# Patient Record
Sex: Female | Born: 1935 | Race: White | Hispanic: No | Marital: Married | Smoking: Former smoker
Health system: Southern US, Community
[De-identification: ages and names within clinical notes are randomized; demographics above are authoritative.]

## PROBLEM LIST (undated history)

## (undated) DIAGNOSIS — J302 Other seasonal allergic rhinitis: Secondary | ICD-10-CM

## (undated) DIAGNOSIS — I499 Cardiac arrhythmia, unspecified: Secondary | ICD-10-CM

## (undated) DIAGNOSIS — R011 Cardiac murmur, unspecified: Secondary | ICD-10-CM

## (undated) DIAGNOSIS — I38 Endocarditis, valve unspecified: Secondary | ICD-10-CM

## (undated) DIAGNOSIS — R748 Abnormal levels of other serum enzymes: Secondary | ICD-10-CM

## (undated) DIAGNOSIS — R05 Cough: Secondary | ICD-10-CM

## (undated) DIAGNOSIS — K279 Peptic ulcer, site unspecified, unspecified as acute or chronic, without hemorrhage or perforation: Secondary | ICD-10-CM

## (undated) DIAGNOSIS — N952 Postmenopausal atrophic vaginitis: Secondary | ICD-10-CM

## (undated) DIAGNOSIS — J45909 Unspecified asthma, uncomplicated: Secondary | ICD-10-CM

## (undated) DIAGNOSIS — I10 Essential (primary) hypertension: Secondary | ICD-10-CM

## (undated) DIAGNOSIS — L439 Lichen planus, unspecified: Secondary | ICD-10-CM

## (undated) DIAGNOSIS — R55 Syncope and collapse: Secondary | ICD-10-CM

## (undated) DIAGNOSIS — K219 Gastro-esophageal reflux disease without esophagitis: Secondary | ICD-10-CM

## (undated) DIAGNOSIS — E785 Hyperlipidemia, unspecified: Secondary | ICD-10-CM

## (undated) DIAGNOSIS — M1990 Unspecified osteoarthritis, unspecified site: Secondary | ICD-10-CM

## (undated) DIAGNOSIS — C50919 Malignant neoplasm of unspecified site of unspecified female breast: Secondary | ICD-10-CM

## (undated) DIAGNOSIS — C50911 Malignant neoplasm of unspecified site of right female breast: Secondary | ICD-10-CM

## (undated) DIAGNOSIS — R053 Chronic cough: Secondary | ICD-10-CM

## (undated) HISTORY — DX: Peptic ulcer, site unspecified, unspecified as acute or chronic, without hemorrhage or perforation: K27.9

## (undated) HISTORY — PX: CARPAL TUNNEL RELEASE: SHX101

## (undated) HISTORY — DX: Syncope and collapse: R55

## (undated) HISTORY — DX: Malignant neoplasm of unspecified site of right female breast: C50.911

## (undated) HISTORY — PX: VAGINOPLASTY: SHX329

## (undated) HISTORY — DX: Postmenopausal atrophic vaginitis: N95.2

## (undated) HISTORY — DX: Hyperlipidemia, unspecified: E78.5

## (undated) HISTORY — PX: COLONOSCOPY: SHX174

## (undated) HISTORY — DX: Unspecified asthma, uncomplicated: J45.909

## (undated) HISTORY — DX: Unspecified osteoarthritis, unspecified site: M19.90

## (undated) HISTORY — PX: BREAST EXCISIONAL BIOPSY: SUR124

## (undated) HISTORY — DX: Essential (primary) hypertension: I10

## (undated) HISTORY — PX: COLONOSCOPY W/ POLYPECTOMY: SHX1380

## (undated) HISTORY — DX: Lichen planus, unspecified: L43.9

---

## 2004-08-22 ENCOUNTER — Ambulatory Visit: Payer: Self-pay | Admitting: Internal Medicine

## 2005-11-28 ENCOUNTER — Ambulatory Visit: Payer: Self-pay | Admitting: Internal Medicine

## 2006-12-03 ENCOUNTER — Ambulatory Visit: Payer: Self-pay | Admitting: Internal Medicine

## 2007-01-02 ENCOUNTER — Ambulatory Visit: Payer: Self-pay | Admitting: Unknown Physician Specialty

## 2007-12-05 ENCOUNTER — Ambulatory Visit: Payer: Self-pay | Admitting: Internal Medicine

## 2008-06-01 ENCOUNTER — Ambulatory Visit: Payer: Self-pay | Admitting: Cardiology

## 2008-12-08 ENCOUNTER — Ambulatory Visit: Payer: Self-pay | Admitting: Internal Medicine

## 2009-04-14 DIAGNOSIS — E782 Mixed hyperlipidemia: Secondary | ICD-10-CM | POA: Insufficient documentation

## 2009-04-14 DIAGNOSIS — I10 Essential (primary) hypertension: Secondary | ICD-10-CM | POA: Insufficient documentation

## 2009-08-05 ENCOUNTER — Ambulatory Visit: Payer: Self-pay | Admitting: Cardiology

## 2009-08-20 ENCOUNTER — Telehealth: Payer: Self-pay | Admitting: Cardiology

## 2009-09-06 ENCOUNTER — Emergency Department: Payer: Self-pay | Admitting: Emergency Medicine

## 2009-12-28 ENCOUNTER — Ambulatory Visit: Payer: Self-pay | Admitting: Internal Medicine

## 2009-12-29 ENCOUNTER — Ambulatory Visit: Payer: Self-pay | Admitting: Internal Medicine

## 2010-02-01 ENCOUNTER — Ambulatory Visit: Payer: Self-pay | Admitting: Internal Medicine

## 2010-02-21 ENCOUNTER — Ambulatory Visit: Payer: Self-pay | Admitting: Surgery

## 2010-02-24 ENCOUNTER — Ambulatory Visit: Payer: Self-pay | Admitting: Surgery

## 2010-09-20 ENCOUNTER — Ambulatory Visit (HOSPITAL_BASED_OUTPATIENT_CLINIC_OR_DEPARTMENT_OTHER): Admission: RE | Admit: 2010-09-20 | Discharge: 2010-09-20 | Payer: Self-pay | Admitting: Orthopedic Surgery

## 2010-11-06 LAB — HM DEXA SCAN

## 2010-12-21 ENCOUNTER — Ambulatory Visit: Payer: Self-pay | Admitting: Obstetrics and Gynecology

## 2010-12-29 ENCOUNTER — Ambulatory Visit: Payer: Self-pay | Admitting: Internal Medicine

## 2011-01-17 LAB — BASIC METABOLIC PANEL
BUN: 14 mg/dL (ref 6–23)
GFR calc Af Amer: >60 mL/min (ref >60)
GFR calc non Af Amer: >60 mL/min (ref >60)
Potassium: 5.5 mEq/L — ABNORMAL HIGH (ref 3.5–5.1)

## 2011-01-17 LAB — POCT HEMOGLOBIN-HEMACUE: Hemoglobin: 15.4 g/dL — ABNORMAL HIGH (ref 12.0–15.0)

## 2011-03-21 NOTE — Assessment & Plan Note (Signed)
[facility] OFFICE NOTE   NAME:[name], [name]                           MRN:          [id number]  DATE:06/01/2008                            DOB:          Aug 01, 1933    [name] is referred by Dr. Aram Beecham to establish her  cardiologist.   She is a delightful 77 year old married white female, whose husband  recently transferred his care to me as well.   Fortunately, [name] is 77 years of age and looks much younger than  that.  She has hypertension and hyperlipidemia being followed and  managed by Dr. Aletha Halim.   She walks about an hour 5-6 days a week.  She walks at about a 4-mile  per hour pace.  She denies any angina or ischemic symptoms.   She was recently started on Bystolic 5 mg a day by Dr. Judithann Sheen for  hypertension.   She offers no complaints including orthopnea, PND, peripheral edema,  tachypalpitations, syncope, or presyncope.   PAST MEDICAL HISTORY:  In addition to the above, she has  gastroesophageal reflux.  She also has some problems with bronchospastic  disease.   SOCIAL HISTORY:  She does not smoke.  She quit in 1970.  She has 2  glasses of wine a day.   Her last cholesterol profile was 188, total triglycerides 153, HDL 46.3,  and LDL 109.  I think, this is on Lipitor, but is not absolutely clear.   CURRENT MEDICATIONS:  1. Lipitor 20 mg p.o. nightly.  2. Aspirin 81 mg a day.  3. Femhrt 5 mg a day.  4. Singulair 10 mg a day.  5. Centrum Silver daily.  6. Calcium daily.  7. Protonix 40 mg p.r.n.  8. Sublingual nitroglycerin 0.4 p.r.n.   She has been evaluated for atypical chest pain in the past by Dr. Lenise Herald.  A stress Myoview was negative for ischemia with EF of 71%.  This was actually performed on April 18, 2006.   FAMILY HISTORY:  Unremarkable for premature coronary artery disease.   SOCIAL HISTORY:  She is married.  She has three children.  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   She seems to be very health conscious.   REVIEW OF SYSTEMS:  Other than the HPI, 12 points of care reviewed and  are negative.  She does have some chronic arthritis and has had an ulcer  in the past.  Details unknown.   PHYSICAL EXAMINATION:  VITAL SIGNS:  She is 5 feet and 4 inches and  weighs 164 pounds.  Her blood pressure is 160/80 in the right arm, pulse  is 83 and regular.  HEENT:  Normocephalic and atraumatic.  PERRLA.  Extraocular movements  intact.  Sclerae clear.  Facial symmetry is normal.  NECK:  Carotid upstrokes are equal bilaterally without bruits.  No JVD.  Thyroid is not enlarged.  Trachea is midline.  LUNGS:  Clear with no rhonchi or wheezes.  HEART:  Reveals a nondisplaced, but  poorly appreciated PMI.  She has  normal S1 and S2.  No gallop.  ABDOMEN:  Soft.  Good bowel sounds.  No midline bruit.  No hepatomegaly.  EXTREMITIES:  Without cyanosis, clubbing, or edema.  Pulses are brisk.  NEURO:  Intact.  SKIN:  Unremarkable.   EKG shows sinus rhythm with an RSR prime in V1 and V2.  There is no  change from her outside ECG.   ASSESSMENT/PLAN:  [name] is currently without any clinically evident  cardiovascular disease.  I told her she probably has some  atherosclerotic plaque considering her age and her other risk factors.  However, she is on an excellent program, though I might consider trying  to drive her LDL below 045.  I have asked to continue with the aspirin  and see Dr. Judithann Sheen back for titration of her Bystolic.  He may have to  max this out before adding something else potentially.   I will see her back on an annual basis or p.r.n.     Thomas C. Daleen Squibb, MD, [facility]  Electronically Signed    TCW/MedQ  DD: 06/01/2008  DT: 06/02/2008  Job #: [id number]   cc:   Aram Beecham, MD

## 2011-04-27 ENCOUNTER — Encounter: Payer: Self-pay | Admitting: Cardiovascular Disease

## 2012-01-18 ENCOUNTER — Ambulatory Visit: Payer: Self-pay | Admitting: Internal Medicine

## 2012-03-07 ENCOUNTER — Ambulatory Visit: Payer: Self-pay | Admitting: Unknown Physician Specialty

## 2012-03-08 LAB — PATHOLOGY REPORT

## 2012-06-06 LAB — HM PAP SMEAR: HM PAP: NORMAL

## 2012-11-06 LAB — HM MAMMOGRAPHY: HM MAMMO: NORMAL

## 2012-11-06 LAB — HM COLONOSCOPY: HM COLON: NORMAL

## 2013-01-27 ENCOUNTER — Ambulatory Visit: Payer: Self-pay | Admitting: Internal Medicine

## 2014-01-28 ENCOUNTER — Ambulatory Visit: Payer: Self-pay | Admitting: Internal Medicine

## 2014-02-03 ENCOUNTER — Ambulatory Visit: Payer: Self-pay | Admitting: Internal Medicine

## 2014-08-06 ENCOUNTER — Ambulatory Visit: Payer: Self-pay | Admitting: Internal Medicine

## 2014-11-06 DIAGNOSIS — C50919 Malignant neoplasm of unspecified site of unspecified female breast: Secondary | ICD-10-CM

## 2014-11-06 HISTORY — PX: MASTECTOMY: SHX3

## 2014-11-06 HISTORY — DX: Malignant neoplasm of unspecified site of unspecified female breast: C50.919

## 2015-01-01 DIAGNOSIS — R079 Chest pain, unspecified: Secondary | ICD-10-CM | POA: Insufficient documentation

## 2015-02-01 ENCOUNTER — Ambulatory Visit: Payer: Self-pay | Admitting: Internal Medicine

## 2015-02-11 ENCOUNTER — Ambulatory Visit
Admit: 2015-02-11 | Disposition: A | Discharge status: Home / Self Care | Payer: Self-pay | Attending: Internal Medicine | Admitting: Internal Medicine

## 2015-02-16 ENCOUNTER — Ambulatory Visit
Admit: 2015-02-16 | Disposition: A | Discharge status: Home / Self Care | Payer: Self-pay | Attending: Internal Medicine | Admitting: Internal Medicine

## 2015-02-16 DIAGNOSIS — C50911 Malignant neoplasm of unspecified site of right female breast: Secondary | ICD-10-CM

## 2015-02-16 HISTORY — DX: Malignant neoplasm of unspecified site of right female breast: C50.911

## 2015-02-16 HISTORY — PX: BREAST BIOPSY: SHX20

## 2015-02-22 ENCOUNTER — Ambulatory Visit (INDEPENDENT_AMBULATORY_CARE_PROVIDER_SITE_OTHER): Payer: Medicare Other | Admitting: General Surgery

## 2015-02-22 ENCOUNTER — Encounter: Payer: Self-pay | Admitting: General Surgery

## 2015-02-22 VITALS — BP 130/70 | HR 68 | Resp 12 | Ht 63.0 in | Wt 167.0 lb

## 2015-02-22 DIAGNOSIS — C50211 Malignant neoplasm of upper-inner quadrant of right female breast: Secondary | ICD-10-CM | POA: Diagnosis not present

## 2015-02-22 DIAGNOSIS — D0511 Intraductal carcinoma in situ of right breast: Secondary | ICD-10-CM

## 2015-02-22 NOTE — Progress Notes (Signed)
Patient ID: [name], female   DOB: 03-Oct-1933, 81 y.o.   MRN: [id number]  Chief Complaint  Patient presents with  . Other    mammogram    HPI [name] is a 81 y.o. female who presents for a breast evaluation. She is here today with her husband Sonia Side and her daughter Magda Paganini. The most recent mammogram was done on 02/01/15 and added views on 02/11/15. Patient had her right breast biopsy done on 02/16/15. She denies any breast problems. Patient does perform regular self breast checks and gets regular mammograms done.    HPI  Past Medical History  Diagnosis Date  . Hypertension     Unspecified  . Hyperlipidemia     Mixed  . Cancer 02/16/15    Right breast    Past Surgical History  Procedure Laterality Date  . Breast lumpectomy Left 2012  . Breast biopsy Right 02/16/15    Family History  Problem Relation Age of Onset  . Colon cancer Paternal Grandfather 66  . Colon cancer Paternal Uncle 61    Social History History  Substance Use Topics  . Smoking status: Former Smoker    Quit date: 11/06/1968  . Smokeless tobacco: Never Used  . Alcohol Use: 0.0 oz/week    0 Standard drinks or equivalent per week    No Known Allergies  Current Outpatient Prescriptions  Medication Sig Dispense Refill  . aspirin 81 MG EC tablet Take 81 mg by mouth daily.      Marland Kitchen atorvastatin (LIPITOR) 20 MG tablet Take 20 mg by mouth daily.      . Calcium Carbonate-Vitamin D (CALCIUM-VITAMIN D3) 600-200 MG-UNIT TABS Take 1 tablet by mouth 2 (two) times daily.      . Calcium-Vitamin D 600-200 MG-UNIT per tablet Take by mouth.    . Multiple Vitamins-Minerals (CENTRUM) tablet Take 1 tablet by mouth daily.      . nebivolol (BYSTOLIC) 10 MG tablet TAKE 1 TABLET ONCE DAILY   PLEASE MAKE AN APPOINTMENT WITH YOUR DOCTOR    . norethindrone-ethinyl estradiol (FEMHRT LOW DOSE) 0.5-2.5 MG-MCG per tablet Take 1 tablet by mouth 2 (two) times daily.      . pantoprazole (PROTONIX) 40 MG tablet Take 40 mg by mouth 2  (two) times daily.      Marland Kitchen PATADAY 0.2 % SOLN     . montelukast (SINGULAIR) 10 MG tablet Take 10 mg by mouth daily.       No current facility-administered medications for this visit.    Review of Systems Review of Systems  Constitutional: Negative.   Respiratory: Negative.   Cardiovascular: Negative.     Blood pressure 130/70, pulse 68, resp. rate 12, height 5\' 3"  (1.6 m), weight 167 lb (75.751 kg).  Physical Exam Physical Exam  Constitutional: She is oriented to person, place, and time. She appears well-developed and well-nourished.  Eyes: Conjunctivae are normal. No scleral icterus.  Neck: Neck supple.  Cardiovascular: Normal rate, regular rhythm and normal heart sounds.   Pulmonary/Chest: Effort normal and breath sounds normal. Right breast exhibits no inverted nipple, no mass, no nipple discharge, no skin change and no tenderness. Left breast exhibits inverted nipple (due to lumpectomy). Left breast exhibits no mass, no nipple discharge, no skin change and no tenderness.  Right breast mild ecchymosis from recent biopsy  Abdominal: Soft. Normal appearance. There is no hepatomegaly. There is no tenderness.  Lymphadenopathy:    She has no cervical adenopathy.    She has no axillary  adenopathy.  Neurological: She is alert and oriented to person, place, and time.    Data Reviewed Mammogram and path reviewed. Imaging showed a small mass in LIQ and a larger lobulated mass in UIQ of right breast. 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 ER/PR pos and Her 2 negative   Assessment    CA right breast 2 foci as above. Lumpectomy will entail losing majority of the inner right breast and cosmetically unacceptable.  Total mastectomy with SN biopsy is best option and this was explained in full to pt. Current clinical stage is 1, and will be same if SN are negative.  Procedure, risks and benefits explained and pt is agreeable.     Plan    Schedule right breast mastectomy and SN biopsy.      PCP:  Braulio Bosch G 02/23/2015, 6:55 AM

## 2015-02-23 ENCOUNTER — Encounter: Payer: Self-pay | Admitting: General Surgery

## 2015-02-23 NOTE — Patient Instructions (Signed)
Surgery to be scheduledMastectomy, With or Without Reconstruction Mastectomy (removal of the breast) is a procedure most commonly used to treat cancer (tumor) of the breast. Different procedures are available for treatment. This depends on the stage of the tumor (abnormal growths). Discuss this with your caregiver, surgeon (a specialist for performing operations such as this), or oncologist (someone specialized in the treatment of cancer). With proper information, you can decide which treatment is best for you. Although the sound of the word cancer is frightening to all of [place], the new treatments and medications can be a source of reassurance and comfort. If there are things you are worried about, discuss them with your caregiver. He or she can help comfort you and your family. Some of the different procedures for treating breast cancer are:  Radical (extensive) mastectomy. This is an operation used to remove the entire breast, the muscles under the breast, and all of the glands (lymph nodes) under the arm. With all of the new treatments available for cancer of the breast, this procedure has become less common.  Modified radical mastectomy. This is a similar operation to the radical mastectomy described above. In the modified radical mastectomy, the muscles of the chest wall are not removed unless one of the lessor muscles is removed. One of the lessor muscles may be removed to allow better removal of the lymph nodes. The axillary lymph nodes are also removed. Rarely, during an axillary node dissection nerves to this area are damaged. Radiation therapy is then often used to the area following this surgery.  A total mastectomy also known as a complete or simple mastectomy. It involves removal of only the breast. The lymph nodes and the muscles are left in place.  In a lumpectomy, the lump is removed from the breast. This is the simplest form of surgical treatment. A sentinel lymph node biopsy may also be  done. Additional treatment may be required. RISKS AND COMPLICATIONS The main problems that follow removal of the breast include:  Infection (germs start growing in the wound). This can usually be treated with antibiotics (medications that kill germs).  Lymphedema. This means the arm on the side of the breast that was operated on swells because the lymph (tissue fluid) cannot follow the main channels back into the body. This only occurs when the lymph nodes have had to be removed under the arm.  There may be some areas of numbness to the upper arm and around the incision (cut by the surgeon) in the breast. This happens because of the cutting of or damage to some of the nerves in the area. This is most often unavoidable.  There may be difficulty moving the arm in a full range of motion (moving in all directions) following surgery. This usually improves with time following use and exercise.  Recurrence of breast cancer may happen with the very best of surgery and follow up treatment. Sometimes small cancer cells that cannot be seen with the naked eye have already spread at the time of surgery. When this happens other treatment is available. This treatment may be radiation, medications or a combination of both. RECONSTRUCTION Reconstruction of the breast may be done immediately if there is not going to be post-operative radiation. This surgery is done for cosmetic (improve appearance) purposes to improve the physical appearance after the operation. This may be done in two ways:  It can be done using a saline filled prosthetic (an artificial breast which is filled with salt water). Silicone breast  implants are now re-approved by the FDA and are being commonly used.  Reconstruction can be done using the body's own muscle/fat/skin. Your caregiver will discuss your options with you. Depending upon your needs or choice, together you will be able to determine which procedure is best for you. Document  Released: 07/18/2001 Document Revised: 07/17/2012 Document Reviewed: 03/10/2008 [facility] Patient Information 2015 [place], [place]. This information is not intended to replace advice given to you by your health care provider. Make sure you discuss any questions you have with your health care provider.

## 2015-02-26 ENCOUNTER — Telehealth: Payer: Self-pay

## 2015-02-26 NOTE — Telephone Encounter (Signed)
Patient called to schedule her mastectomy and SLN Biopsy. Patient is scheduled for surgery at [facility] on 03/19/15. She will pre admit at the hospital. Preadmission paperwork had been mailed to the patient. We will call her with an arrival time for her SLN injection on 03/19/15. Patient is aware of date and instructions.

## 2015-03-01 ENCOUNTER — Other Ambulatory Visit: Payer: Self-pay | Admitting: General Surgery

## 2015-03-01 ENCOUNTER — Other Ambulatory Visit: Payer: Self-pay

## 2015-03-01 DIAGNOSIS — C50211 Malignant neoplasm of upper-inner quadrant of right female breast: Secondary | ICD-10-CM

## 2015-03-01 LAB — SURGICAL PATHOLOGY

## 2015-03-03 ENCOUNTER — Other Ambulatory Visit: Payer: Self-pay | Admitting: *Deleted

## 2015-03-03 ENCOUNTER — Ambulatory Visit
Admit: 2015-03-03 | Disposition: A | Discharge status: Home / Self Care | Payer: Self-pay | Attending: Oncology | Admitting: Oncology

## 2015-03-03 ENCOUNTER — Other Ambulatory Visit: Payer: Self-pay | Admitting: General Surgery

## 2015-03-03 DIAGNOSIS — C50211 Malignant neoplasm of upper-inner quadrant of right female breast: Secondary | ICD-10-CM

## 2015-03-11 ENCOUNTER — Other Ambulatory Visit: Payer: Self-pay

## 2015-03-11 ENCOUNTER — Encounter
Admission: RE | Admit: 2015-03-11 | Discharge: 2015-03-11 | Disposition: A | Discharge status: Home / Self Care | Payer: Medicare Other | Source: Ambulatory Visit | Attending: General Surgery | Admitting: General Surgery

## 2015-03-11 DIAGNOSIS — I10 Essential (primary) hypertension: Secondary | ICD-10-CM | POA: Insufficient documentation

## 2015-03-11 DIAGNOSIS — Z01810 Encounter for preprocedural cardiovascular examination: Secondary | ICD-10-CM | POA: Insufficient documentation

## 2015-03-11 HISTORY — DX: Gastro-esophageal reflux disease without esophagitis: K21.9

## 2015-03-11 HISTORY — DX: Other seasonal allergic rhinitis: J30.2

## 2015-03-11 HISTORY — DX: Abnormal levels of other serum enzymes: R74.8

## 2015-03-11 NOTE — Patient Instructions (Signed)
  Your procedure is scheduled on: 03/19/15 Report to Day Surgery. To find out your arrival time please call [phone] between 1PM - 3PM on 03/18/15.  Remember: Instructions that are not followed completely may result in serious medical risk, up to and including death, or upon the discretion of your surgeon and anesthesiologist your surgery may need to be rescheduled.    __x__ 1. Do not eat food or drink liquids after midnight. No gum chewing or hard candies.     _x___ 2. No Alcohol for 24 hours before or after surgery.   ____ 3. Bring all medications with you on the day of surgery if instructed.    __x__ 4. Notify your doctor if there is any change in your medical condition     (cold, fever, infections).     Do not wear jewelry, make-up, hairpins, clips or nail polish.  Do not wear lotions, powders, or perfumes. You may wear deodorant.  Do not shave 48 hours prior to surgery. Men may shave face and neck.  Do not bring valuables to the hospital.    [facility] is not responsible for any belongings or valuables.               Contacts, dentures or bridgework may not be worn into surgery.  Leave your suitcase in the car. After surgery it may be brought to your room.  For patients admitted to the hospital, discharge time is determined by your                treatment team.   Patients discharged the day of surgery will not be allowed to drive home.   Please read over the following fact sheets that you were given:      ____ Take these medicines the morning of surgery with A SIP OF WATER:    1. nebivolol (BYSTOLIC) 10 MG tablet  2. pantoprazole (PROTONIX) 40 MG tablet  3.   4.  5.  6.  ____ Fleet Enema (as directed)   ____ Use CHG Soap as directed  ____ Use inhalers on the day of surgery  ____ Stop metformin 2 days prior to surgery    ____ Take 1/2 of usual insulin dose the night before surgery and none on the morning of surgery.   _x__ Stop Coumadin/Plavix/aspirin on  aspirin 1 week before surgery   __x_ Stop Anti-inflammatories on stop   ____ Stop supplements until after surgery.    ____ Bring C-Pap to the hospital.

## 2015-03-15 MED ORDER — HEPARIN SODIUM (PORCINE) 1000 UNIT/ML IJ SOLN
INTRAMUSCULAR | Status: AC
  Filled 2015-03-15: qty 1

## 2015-03-15 MED ORDER — VERAPAMIL HCL 2.5 MG/ML IV SOLN
INTRAVENOUS | Status: AC
  Filled 2015-03-15: qty 2

## 2015-03-15 NOTE — OR Nursing (Addendum)
Negative stress test 01/11/15 Dr Nehemiah Massed Negative stress/echo cleared by [facility]

## 2015-03-16 ENCOUNTER — Telehealth: Payer: Self-pay | Admitting: General Surgery

## 2015-03-16 NOTE — Telephone Encounter (Signed)
Patient had called with some questions. She is scheduled for right total mastectomy with SN biopsy on 03/19/15. Her husband had a mild stoke affecting speech only. He is doing better.  Pt needed some clarifications on her surgery, expected time for recovery, and some discussion regarding subsequent recnstruction. Her questions were answered and pt appeared  satisfied.

## 2015-03-19 ENCOUNTER — Ambulatory Visit
Admission: RE | Admit: 2015-03-19 | Discharge: 2015-03-19 | Disposition: A | Discharge status: Home / Self Care | Payer: Medicare Other | Source: Ambulatory Visit | Attending: General Surgery | Admitting: General Surgery

## 2015-03-19 ENCOUNTER — Inpatient Hospital Stay: Payer: Medicare Other | Admitting: Anesthesiology

## 2015-03-19 ENCOUNTER — Encounter
Admission: RE | Disposition: A | Discharge status: Home / Self Care | Payer: Self-pay | Source: Ambulatory Visit | Attending: General Surgery

## 2015-03-19 ENCOUNTER — Encounter: Payer: Self-pay | Admitting: Anesthesiology

## 2015-03-19 DIAGNOSIS — C50911 Malignant neoplasm of unspecified site of right female breast: Secondary | ICD-10-CM

## 2015-03-19 DIAGNOSIS — C50211 Malignant neoplasm of upper-inner quadrant of right female breast: Secondary | ICD-10-CM

## 2015-03-19 DIAGNOSIS — R920 Mammographic microcalcification found on diagnostic imaging of breast: Secondary | ICD-10-CM | POA: Insufficient documentation

## 2015-03-19 DIAGNOSIS — I10 Essential (primary) hypertension: Secondary | ICD-10-CM | POA: Insufficient documentation

## 2015-03-19 DIAGNOSIS — E785 Hyperlipidemia, unspecified: Secondary | ICD-10-CM | POA: Insufficient documentation

## 2015-03-19 DIAGNOSIS — Z87891 Personal history of nicotine dependence: Secondary | ICD-10-CM | POA: Diagnosis not present

## 2015-03-19 DIAGNOSIS — Z800 Family history of malignant neoplasm of digestive organs: Secondary | ICD-10-CM | POA: Diagnosis not present

## 2015-03-19 DIAGNOSIS — Z7982 Long term (current) use of aspirin: Secondary | ICD-10-CM | POA: Insufficient documentation

## 2015-03-19 DIAGNOSIS — D0511 Intraductal carcinoma in situ of right breast: Secondary | ICD-10-CM | POA: Diagnosis not present

## 2015-03-19 DIAGNOSIS — Z9889 Other specified postprocedural states: Secondary | ICD-10-CM | POA: Insufficient documentation

## 2015-03-19 DIAGNOSIS — Z79899 Other long term (current) drug therapy: Secondary | ICD-10-CM | POA: Diagnosis not present

## 2015-03-19 HISTORY — PX: AXILLARY LYMPH NODE BIOPSY: SHX5737

## 2015-03-19 HISTORY — PX: SIMPLE MASTECTOMY WITH AXILLARY SENTINEL NODE BIOPSY: SHX6098

## 2015-03-19 HISTORY — PX: SENTINEL NODE BIOPSY: SHX6608

## 2015-03-19 SURGERY — SIMPLE MASTECTOMY
Anesthesia: General | Laterality: Right | Wound class: Clean

## 2015-03-19 MED ORDER — FENTANYL CITRATE (PF) 100 MCG/2ML IJ SOLN
INTRAMUSCULAR | Status: AC
  Administered 2015-03-19: 25 ug via INTRAVENOUS
  Filled 2015-03-19: qty 2

## 2015-03-19 MED ORDER — EPHEDRINE SULFATE 50 MG/ML IJ SOLN
INTRAMUSCULAR | Status: DC | PRN
  Administered 2015-03-19 (×2): 5 mg via INTRAVENOUS

## 2015-03-19 MED ORDER — ONDANSETRON HCL 4 MG/2ML IJ SOLN
INTRAMUSCULAR | Status: DC | PRN
  Administered 2015-03-19: 4 mg via INTRAVENOUS

## 2015-03-19 MED ORDER — CEFAZOLIN SODIUM-DEXTROSE 2-3 GM-% IV SOLR
INTRAVENOUS | Status: AC
  Administered 2015-03-19: 2 g via INTRAVENOUS
  Filled 2015-03-19: qty 50

## 2015-03-19 MED ORDER — ONDANSETRON 4 MG PO TBDP: 4.0000 mg | ORAL_TABLET | Freq: Once | ORAL | Status: DC

## 2015-03-19 MED ORDER — CEFAZOLIN SODIUM-DEXTROSE 2-3 GM-% IV SOLR
2.0000 g | Freq: Once | INTRAVENOUS | Status: AC
  Administered 2015-03-19: 2 g via INTRAVENOUS

## 2015-03-19 MED ORDER — OXYCODONE-ACETAMINOPHEN 5-325 MG PO TABS
1.0000 | ORAL_TABLET | ORAL | Status: DC | PRN
  Administered 2015-03-19: 1 via ORAL

## 2015-03-19 MED ORDER — STERILE WATER FOR IRRIGATION IR SOLN
Status: DC | PRN
  Administered 2015-03-19: 1000 mL

## 2015-03-19 MED ORDER — SODIUM CHLORIDE 0.9 % IJ SOLN
INTRAMUSCULAR | Status: AC
  Filled 2015-03-19: qty 10

## 2015-03-19 MED ORDER — ACETAMINOPHEN 10 MG/ML IV SOLN
INTRAVENOUS | Status: AC
  Administered 2015-03-19: 1000 mg via INTRAVENOUS
  Filled 2015-03-19: qty 100

## 2015-03-19 MED ORDER — OXYCODONE-ACETAMINOPHEN 5-325 MG PO TABS: 1.0000 | ORAL_TABLET | ORAL | Status: DC | PRN

## 2015-03-19 MED ORDER — FENTANYL CITRATE (PF) 100 MCG/2ML IJ SOLN
25.0000 ug | INTRAMUSCULAR | Status: DC | PRN
  Administered 2015-03-19 (×3): 25 ug via INTRAVENOUS

## 2015-03-19 MED ORDER — FENTANYL CITRATE (PF) 100 MCG/2ML IJ SOLN
INTRAMUSCULAR | Status: DC | PRN
  Administered 2015-03-19 (×2): 50 ug via INTRAVENOUS
  Administered 2015-03-19 (×2): 25 ug via INTRAVENOUS

## 2015-03-19 MED ORDER — PROPOFOL 10 MG/ML IV BOLUS
INTRAVENOUS | Status: DC | PRN
  Administered 2015-03-19: 150 mg via INTRAVENOUS

## 2015-03-19 MED ORDER — TECHNETIUM TC 99M SULFUR COLLOID
2.0000 | Freq: Once | INTRAVENOUS | Status: AC | PRN
  Administered 2015-03-19: 1.04 via INTRAVENOUS

## 2015-03-19 MED ORDER — ONDANSETRON 4 MG PO TBDP
ORAL_TABLET | ORAL | Status: AC
  Administered 2015-03-19: 4 mg
  Filled 2015-03-19: qty 1

## 2015-03-19 MED ORDER — ONDANSETRON HCL 4 MG/2ML IJ SOLN: 4.0000 mg | Freq: Once | INTRAMUSCULAR | Status: DC | PRN

## 2015-03-19 MED ORDER — GLYCOPYRROLATE 0.2 MG/ML IJ SOLN
INTRAMUSCULAR | Status: DC | PRN
  Administered 2015-03-19: .15 mg via INTRAVENOUS

## 2015-03-19 MED ORDER — METHYLENE BLUE 1 % INJ SOLN
INTRAMUSCULAR | Status: AC
  Filled 2015-03-19: qty 10

## 2015-03-19 MED ORDER — ONDANSETRON HCL 4 MG PO TABS: 4.0000 mg | ORAL_TABLET | Freq: Three times a day (TID) | ORAL | Status: DC | PRN

## 2015-03-19 MED ORDER — OXYCODONE-ACETAMINOPHEN 5-325 MG PO TABS
ORAL_TABLET | ORAL | Status: AC
  Filled 2015-03-19: qty 1

## 2015-03-19 MED ORDER — LACTATED RINGERS IV SOLN
INTRAVENOUS | Status: DC
  Administered 2015-03-19: 08:00:00 via INTRAVENOUS

## 2015-03-19 MED ORDER — MIDAZOLAM HCL 2 MG/2ML IJ SOLN
INTRAMUSCULAR | Status: DC | PRN
  Administered 2015-03-19: 1 mg via INTRAVENOUS

## 2015-03-19 MED ORDER — LIDOCAINE HCL (CARDIAC) 20 MG/ML IV SOLN
INTRAVENOUS | Status: DC | PRN
  Administered 2015-03-19: 30 mg via INTRAVENOUS

## 2015-03-19 MED ORDER — DEXAMETHASONE SODIUM PHOSPHATE 4 MG/ML IJ SOLN
INTRAMUSCULAR | Status: DC | PRN
  Administered 2015-03-19: 5 mg via INTRAVENOUS

## 2015-03-19 SURGICAL SUPPLY — 50 items
APPLIER CLIP 11 MED OPEN (CLIP)
APPLIER CLIP 13 LRG OPEN (CLIP)
BULB RESERV EVAC DRAIN JP 100C (MISCELLANEOUS) ×3 IMPLANT
CANISTER SUCT 1200ML W/VALVE (MISCELLANEOUS) ×3 IMPLANT
CHLORAPREP W/TINT 26ML (MISCELLANEOUS) ×3 IMPLANT
CLIP APPLIE 11 MED OPEN (CLIP) IMPLANT
CLIP APPLIE 13 LRG OPEN (CLIP) IMPLANT
CLOSURE WOUND 1/2 X4 (GAUZE/BANDAGES/DRESSINGS) ×2
CNTNR SPEC 2.5X3XGRAD LEK (MISCELLANEOUS) ×3
CONT SPEC 4OZ STER OR WHT (MISCELLANEOUS) ×6
CONTAINER SPEC 2.5X3XGRAD LEK (MISCELLANEOUS) ×3 IMPLANT
DEVICE DISSECT PLASMABLAD 3.0S (MISCELLANEOUS) ×1 IMPLANT
DRAIN CHANNEL JP 15F RND 16 (MISCELLANEOUS) ×3 IMPLANT
DRAPE LAPAROTOMY TRNSV 106X77 (MISCELLANEOUS) ×3 IMPLANT
DRSG TELFA 3X8 NADH (GAUZE/BANDAGES/DRESSINGS) ×3 IMPLANT
ELECT CAUTERY BLADE 6.4 (BLADE) ×3 IMPLANT
GAUZE FLUFF 18X24 1PLY STRL (GAUZE/BANDAGES/DRESSINGS) ×3 IMPLANT
GAUZE SPONGE 4X4 12PLY STRL (GAUZE/BANDAGES/DRESSINGS) ×3 IMPLANT
GLOVE BIO SURGEON STRL SZ7 (GLOVE) ×9 IMPLANT
GOWN STRL REUS W/ TWL LRG LVL3 (GOWN DISPOSABLE) ×2 IMPLANT
GOWN STRL REUS W/TWL LRG LVL3 (GOWN DISPOSABLE) ×4
HARMONIC SCALPEL FOCUS (MISCELLANEOUS) IMPLANT
KIT RM TURNOVER STRD PROC AR (KITS) ×3 IMPLANT
LABEL OR SOLS (LABEL) ×3 IMPLANT
LIQUID BAND (GAUZE/BANDAGES/DRESSINGS) ×3 IMPLANT
PACK BASIN MINOR ARMC (MISCELLANEOUS) ×3 IMPLANT
PAD ABD DERMACEA PRESS 5X9 (GAUZE/BANDAGES/DRESSINGS) ×3 IMPLANT
PAD GROUND ADULT SPLIT (MISCELLANEOUS) ×3 IMPLANT
PIN SAFETY STRL (MISCELLANEOUS) ×3 IMPLANT
PLASMABLADE 3.0S (MISCELLANEOUS) ×3
SLEVE PROBE SENORX GAMMA FIND (MISCELLANEOUS) ×3 IMPLANT
SPONGE LAP 18X18 5 PK (GAUZE/BANDAGES/DRESSINGS) ×3 IMPLANT
STRIP CLOSURE SKIN 1/2X4 (GAUZE/BANDAGES/DRESSINGS) ×4 IMPLANT
SURGI-BRA LG (MISCELLANEOUS) ×3 IMPLANT
SUT ETHILON 3-0 FS-10 30 BLK (SUTURE) ×3
SUT MNCRL AB 3-0 PS2 27 (SUTURE) ×3 IMPLANT
SUT SILK 2 0 (SUTURE)
SUT SILK 2-0 18XBRD TIE 12 (SUTURE) IMPLANT
SUT SILK 3 0 (SUTURE)
SUT SILK 3-0 18XBRD TIE 12 (SUTURE) IMPLANT
SUT SILK 4 0 (SUTURE)
SUT SILK 4-0 18XBRD TIE 12 (SUTURE) IMPLANT
SUT VIC AB 2-0 CT1 27 (SUTURE) ×4
SUT VIC AB 2-0 CT1 TAPERPNT 27 (SUTURE) ×2 IMPLANT
SUT VICRYL+ 3-0 144IN (SUTURE) IMPLANT
SUTURE EHLN 3-0 FS-10 30 BLK (SUTURE) ×1 IMPLANT
SWABSTK COMLB BENZOIN TINCTURE (MISCELLANEOUS) ×3 IMPLANT
TUBING CONNECTING 10 (TUBING) ×2 IMPLANT
TUBING CONNECTING 10' (TUBING) ×1
WATER STERILE IRR 1000ML POUR (IV SOLUTION) ×3 IMPLANT

## 2015-03-19 NOTE — Interval H&P Note (Signed)
History and Physical Interval Note:  03/19/2015 8:52 AM  [name]  has presented today for surgery, with the diagnosis of RIGHT BREAST CANCER  The various methods of treatment have been discussed with the patient and family. After consideration of risks, benefits and other options for treatment, the patient has consented to  Procedure(s): SIMPLE MASTECTOMY (Right) SENTINEL NODE BIOPSY (Right) AXILLARY LYMPH NODE BIOPSY (Right) as a surgical intervention .  The patient's history has been reviewed, patient examined, no change in status, stable for surgery.  I have reviewed the patient's chart and labs.  Questions were answered to the patient's satisfaction.     SANKAR,SEEPLAPUTHUR G

## 2015-03-19 NOTE — Anesthesia Postprocedure Evaluation (Signed)
  Anesthesia Post-op Note  Patient: [name]  Procedure(s) Performed: Procedure(s): right total MASTECTOMY with sentinel node biopsy  (Right) SENTINEL NODE BIOPSY (Right) AXILLARY LYMPH NODE BIOPSY (Right)  Anesthesia type:General  Patient location: PACU  Post pain: Pain level controlled  Post assessment: Post-op Vital signs reviewed, Patient's Cardiovascular Status Stable, Respiratory Function Stable, Patent Airway and No signs of Nausea or vomiting  Post vital signs: Reviewed and stable  Last Vitals:  Filed Vitals:   03/19/15 1115  BP:   Pulse: 89  Temp:   Resp: 18    Level of consciousness: awake, alert  and patient cooperative  Complications: No apparent anesthesia complications

## 2015-03-19 NOTE — Anesthesia Procedure Notes (Signed)
Procedure Name: LMA Insertion Performed by: Kennon Holter Pre-anesthesia Checklist: Patient identified, Emergency Drugs available, Suction available, Patient being monitored and Timeout performed Patient Re-evaluated:Patient Re-evaluated prior to inductionOxygen Delivery Method: Circle system utilized Preoxygenation: Pre-oxygenation with 100% oxygen Intubation Type: IV induction Ventilation: Mask ventilation without difficulty LMA Size: 4.0 Number of attempts: 1 Dental Injury: Teeth and Oropharynx as per pre-operative assessment

## 2015-03-19 NOTE — H&P (View-Only) (Signed)
Patient ID: [name], female   DOB: [birth year], 81 y.o.   MRN: [id number]  Chief Complaint  Patient presents with  . Other    mammogram    HPI [name] is a 81 y.o. female who presents for a breast evaluation. She is here today with her husband [name] and her daughter [name]. The most recent mammogram was done on 02/01/15 and added views on 02/11/15. Patient had her right breast biopsy done on 02/16/15. She denies any breast problems. Patient does perform regular self breast checks and gets regular mammograms done.    HPI  Past Medical History  Diagnosis Date  . Hypertension     Unspecified  . Hyperlipidemia     Mixed  . Cancer 02/16/15    Right breast    Past Surgical History  Procedure Laterality Date  . Breast lumpectomy Left 2012  . Breast biopsy Right 02/16/15    Family History  Problem Relation Age of Onset  . Colon cancer Paternal Grandfather 53  . Colon cancer Paternal Uncle 39    Social History History  Substance Use Topics  . Smoking status: Former Smoker    Quit date: 11/06/1968  . Smokeless tobacco: Never Used  . Alcohol Use: 0.0 oz/week    0 Standard drinks or equivalent per week    No Known Allergies  Current Outpatient Prescriptions  Medication Sig Dispense Refill  . aspirin 81 MG EC tablet Take 81 mg by mouth daily.      Marland Kitchen atorvastatin (LIPITOR) 20 MG tablet Take 20 mg by mouth daily.      . Calcium Carbonate-Vitamin D (CALCIUM-VITAMIN D3) 600-200 MG-UNIT TABS Take 1 tablet by mouth 2 (two) times daily.      . Calcium-Vitamin D 600-200 MG-UNIT per tablet Take by mouth.    . Multiple Vitamins-Minerals (CENTRUM) tablet Take 1 tablet by mouth daily.      . nebivolol (BYSTOLIC) 10 MG tablet TAKE 1 TABLET ONCE DAILY   PLEASE MAKE AN APPOINTMENT WITH YOUR DOCTOR    . norethindrone-ethinyl estradiol (FEMHRT LOW DOSE) 0.5-2.5 MG-MCG per tablet Take 1 tablet by mouth 2 (two) times daily.      . pantoprazole (PROTONIX) 40 MG tablet Take 40 mg by mouth 2  (two) times daily.      Marland Kitchen PATADAY 0.2 % SOLN     . montelukast (SINGULAIR) 10 MG tablet Take 10 mg by mouth daily.       No current facility-administered medications for this visit.    Review of Systems Review of Systems  Constitutional: Negative.   Respiratory: Negative.   Cardiovascular: Negative.     Blood pressure 130/70, pulse 68, resp. rate 12, height 5\' 3"  (1.6 m), weight 167 lb (75.751 kg).  Physical Exam Physical Exam  Constitutional: She is oriented to person, place, and time. She appears well-developed and well-nourished.  Eyes: Conjunctivae are normal. No scleral icterus.  Neck: Neck supple.  Cardiovascular: Normal rate, regular rhythm and normal heart sounds.   Pulmonary/Chest: Effort normal and breath sounds normal. Right breast exhibits no inverted nipple, no mass, no nipple discharge, no skin change and no tenderness. Left breast exhibits inverted nipple (due to lumpectomy). Left breast exhibits no mass, no nipple discharge, no skin change and no tenderness.  Right breast mild ecchymosis from recent biopsy  Abdominal: Soft. Normal appearance. There is no hepatomegaly. There is no tenderness.  Lymphadenopathy:    She has no cervical adenopathy.    She has no axillary  adenopathy.  Neurological: She is alert and oriented to person, place, and time.    Data Reviewed Mammogram and path reviewed. Imaging showed a small mass in LIQ and a larger lobulated mass in UIQ of right breast. 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 ER/PR pos and Her 2 negative   Assessment    CA right breast 2 foci as above. Lumpectomy will entail losing majority of the inner right breast and cosmetically unacceptable.  Total mastectomy with SN biopsy is best option and this was explained in full to pt. Current clinical stage is 1, and will be same if SN are negative.  Procedure, risks and benefits explained and pt is agreeable.     Plan    Schedule right breast mastectomy and SN biopsy.      PCP:  Braulio Bosch G 02/23/2015, 6:55 AM

## 2015-03-19 NOTE — Discharge Instructions (Addendum)
BRCA-1 and BRCA-2 BRCA-1 and BRCA-2 are 2 genes that are linked with hereditary breast and ovarian cancers. About 200,000 women are diagnosed with invasive breast cancer each year and about 23,000 with ovarian cancer (according to the [place]). Of these cancers, about 5% to 10% will be due to a mutation in one of the BRCA genes. Men can also inherit an increased risk of developing breast cancer, primarily from an alteration in the BRCA-2 gene.  Individuals with mutations in BRCA1 or BRCA2 have significantly elevated risks for breast cancer (up to 80% lifetime risk), ovarian cancer (up to 40% lifetime risk), bilateral breast cancer and other types of cancers. BRCA mutations are inherited and passed from generation to generation. One half of the time, they are passed from the father's side of the family.  The DNA in white blood cells is used to detect mutations in the BRCA genes. While the gene products (proteins) of the BRCA genes act only in breast and ovarian tissue, the genes are present in every cell of the body and blood is the most easily accessible source of that DNA. PREPARATION FOR TEST The test for BRCA mutations is done on a blood sample collected by needle from a vein in the arm. The test does not require surgical biopsy of breast or ovarian tissue.  NORMAL FINDINGS No genetic mutations. Ranges for normal findings may vary among different laboratories and hospitals. You should always check with your doctor after having lab work or other tests done to discuss the meaning of your test results and whether your values are considered within normal limits. MEANING OF TEST  Your caregiver will go over the test results with you and discuss the importance and meaning of your results, as well as treatment options and the need for additional tests if necessary. OBTAINING THE TEST RESULTS It is your responsibility to obtain your test results. Ask the lab or department performing the test  when and how you will get your results. OTHER THINGS TO KNOW Your test results may have implications for other family members. When one member of a family is tested for BRCA mutations, issues often arise about how or whether to share this information with other family members. Seek advice from a genetic counselor about communication of result with your family members.  Pre and post test consultation with a health care provider knowledgeable about genetic testing cannot be overemphasized.  There are many issues to be considered when preparing for a genetic test and upon learning the results, and a genetic counselor has the knowledge and experience to help you sort through them.  If the BRCA test is positive, the options include increased frequency of check-ups (e.g., mammography, blood tests for CA-125, or transvaginal ultrasonography); medications that could reduce risk (e.g., oral contraceptives or tamoxifen); or surgical removal of the ovaries or breasts. There are a number of variables involved and it is important to discuss your options with your doctor and genetic counselor. Research studies have reported that for every 1000 women negative for BRCA mutations, between 12 and 31 of them will develop breast cancer by age 49 and between 50 and 18 will develop ovarian cancer by age 27. The risk increases with age. The test can be ordered by a doctor, preferably by one who can also offer genetic counseling. The blood sample will be sent to a laboratory that specializes in BRCA testing. The American Society of Clinical Oncology and the [place] encourage women seeking the  test to participate in Learn-term outcome studies to help gather information on the effectiveness of different check-up and treatment options. Document Released: 11/16/2004 Document Revised: 01/15/2012 Document Reviewed: 01/23/2014 [facility] Patient Information 2015 [place], [place]. This information is not intended to  replace advice given to you by your health care provider. Make sure you discuss any questions you have with your health care provider.  Breast Biopsy A breast biopsy is a test during which a sample of tissue is taken from your breast. The breast tissue is looked at under a microscope for cancer cells.  BEFORE THE PROCEDURE  Make plans to have someone drive you home after the test.  Do not smoke for 2 weeks before the test. Stop smoking, if you smoke.  Do not drink alcohol for 24 hours before the test.  Wear a good support bra to the test. PROCEDURE  You may be given one of the following:  A medicine to numb the breast area (local anesthetic).  A medicine to make you fall asleep (general anesthetic). There are different types of breast biopsies. They include:  Fine-needle aspiration.  A needle is put into the breast lump.  The needle takes out fluid and cells from the lump.  Ultrasound imaging may be used to help find the lump and to put the needle in the right spot.  Core-needle biopsy.  A needle is put into the breast lump.  The needle is put in your breast 3-6 times.  The needle removes breast tissue.  An ultrasound image or X-ray is often used to find the right spot to put in the needle.  Stereotactic biopsy.  X-rays and a computer are used to study X-ray pictures of the breast lump.  The computer finds where the needle needs to be put into the breast.  Tissue samples are taken out.  Vacuum-assisted biopsy.  A small cut (incision) is made in your breast.  A biopsy device is put through the cut and into the breast tissue.  The biopsy device draws abnormal breast tissue into the biopsy device.  A large tissue sample is often removed.  No stitches are needed.  Ultrasound-guided core-needle biopsy.  Ultrasound imaging helps guide the needle into the area of the breast that is not normal.  A cut is made in the breast. The needle is put into the breast  lump.  Tissue samples are taken out.  Open biopsy.  A large cut is made in the breast.  Your doctor will try to remove the whole breast lump or as much as possible. All tissue, fluid, or cell samples are looked at under a microscope.  AFTER THE PROCEDURE  You will be taken to an area to recover. You will be able to go home once you are doing well and are without problems.  You may have bruising on your breast. This is normal.  A pressure bandage (dressing) may be put on your breast for 24-48 hours. This type of bandage is wrapped tightly around your chest. It helps stop fluid from building up underneath tissues. Document Released: 01/15/2012 Document Revised: 03/09/2014 Document Reviewed: 01/15/2012 [facility] Patient Information 2015 [place], [place]. This information is not intended to replace advice given to you by your health care provider. Make sure you discuss any questions you have with your health care provider.

## 2015-03-19 NOTE — Transfer of Care (Signed)
Immediate Anesthesia Transfer of Care Note  Patient: [name]  Procedure(s) Performed: Procedure(s): right total MASTECTOMY with sentinel node biopsy  (Right) SENTINEL NODE BIOPSY (Right) AXILLARY LYMPH NODE BIOPSY (Right)  Patient Location: PACU:  VSS, nAD,  Anesthesia Type:General  Level of Consciousness: sedated  Airway & Oxygen Therapy: Patient Spontanous Breathing  Post-op Assessment: Report given to RN  Post vital signs: stable  Last Vitals:  Filed Vitals:   03/19/15 1043  BP: 141/70  Pulse: 89  Temp: 36.2 C  Resp: 20    Complications: No apparent anesthesia complications

## 2015-03-19 NOTE — Anesthesia Preprocedure Evaluation (Signed)
Anesthesia Evaluation  Patient identified by MRN, date of birth, ID band Patient awake    Reviewed: Allergy & Precautions, NPO status , Patient's Chart, lab work & pertinent test results  Airway Mallampati: II  TM Distance: >3 FB Neck ROM: Full    Dental  (+) Chipped   Pulmonary former smoker,          Cardiovascular hypertension,     Neuro/Psych    GI/Hepatic GERD-  Controlled,  Endo/Other    Renal/GU      Musculoskeletal   Abdominal   Peds  Hematology   Anesthesia Other Findings   Reproductive/Obstetrics                             Anesthesia Physical Anesthesia Plan  ASA: II  Anesthesia Plan: General   Post-op Pain Management:    Induction: Intravenous  Airway Management Planned: LMA  Additional Equipment:   Intra-op Plan:   Post-operative Plan:   Informed Consent: I have reviewed the patients History and Physical, chart, labs and discussed the procedure including the risks, benefits and alternatives for the proposed anesthesia with the patient or authorized representative who has indicated his/her understanding and acceptance.     Plan Discussed with: CRNA  Anesthesia Plan Comments:         Anesthesia Quick Evaluation

## 2015-03-19 NOTE — Op Note (Signed)
Preop diagnosis: Carcinoma right breast  Post op diagnosis: Same  Operation: Right total mastectomy with sentinel lymph node biopsy  Anesthesia: Gen.  Complications: None  EBL: 50 mL  Drains: One JP drain  Description: Patient was put to sleep with an LMA. Prior to the procedure she underwent nuclear contrast injection in the right breast. There was good signal activity in the axilla and therefore the blue dye was not utilized. The right breast and axilla were then prepped and draped as sterile field. Timeout was performed. The mastectomy incision was mapped out. Using a plasma knife the incision was made and the initial flap was created towards the axilla. With the axillary fat pad entered and using the Gamma finder 2 sentinel nodes was signal activity and 1  non-sentinel node adjacent to them were identified. These were removed separately and sent off as sentinel node #1 sentinel node #2 and non-sentinel node for frozen section. Subsequent report on these showed no evidence of macro metastases. Superior and inferior flaps were then completed superiorly to the infraclavicular space and inferiorly to the upper rectus fascia. The breast along the pectoral fascia was dissected off the pectoralis major muscle from medial to lateral aspect and the lateral attachments were taken down. Bleeding was controlled with the use of Plasma-Lyte below. The lateral end of the breast was tagged and then sent to pathology for histologic evaluation. The wound was irrigated and after ensuring hemostasis drained with a Jackson-Pratt drain brought out through a stab incision inferiorly. The subcutaneous tissue was closed with interrupted   2-0 Vicryl. Skin was closed with 2 running sutures of subcuticular 3-0 Monocryl. Drain was passed through the skin with a nylon stitch. Incision was covered LiquiBand. Dressing was with fluffs and ABDs and a surgical bra. Patient was subsequently returned to the recovery room in stable  condition.

## 2015-03-21 ENCOUNTER — Encounter: Payer: Self-pay | Admitting: General Surgery

## 2015-03-22 ENCOUNTER — Ambulatory Visit (INDEPENDENT_AMBULATORY_CARE_PROVIDER_SITE_OTHER): Payer: Medicare Other | Admitting: General Surgery

## 2015-03-22 ENCOUNTER — Encounter: Payer: Self-pay | Admitting: General Surgery

## 2015-03-22 VITALS — BP 140/68 | HR 74 | Resp 12 | Ht 63.0 in | Wt 166.0 lb

## 2015-03-22 DIAGNOSIS — C50211 Malignant neoplasm of upper-inner quadrant of right female breast: Secondary | ICD-10-CM

## 2015-03-22 NOTE — Patient Instructions (Signed)
Patient to return on Thursday for drain check. Nurse Patient to see doctor in one week.

## 2015-03-22 NOTE — Addendum Note (Signed)
Addendum  created 03/22/15 1054 by Kennon Holter, CRNA   Modules edited: Charges VN

## 2015-03-22 NOTE — Progress Notes (Addendum)
Patient ID: [name], female   DOB: 1932-11-22, 81 y.o.   MRN: [id number] Patient here for a post op visit from a right mastectomy done on 03/19/15.Drain sheet present. Patient states she is doing well.  Mastectomy site is clean and healing well. Drain is intact. Drained 19ml each of last 2 days. Padded dressing reapplied. Patient to return in 3 days for drain check. Follow up with me in 1 week

## 2015-03-23 ENCOUNTER — Telehealth: Payer: Self-pay

## 2015-03-23 NOTE — Telephone Encounter (Signed)
Post surgery follow-up phone call.  Patient states she is doing well.  Returns to Dr. Angie Fava office Thursday for possible drain removal.  She has an appointment with Dr. Oliva Bustard on 03/29/15. Oncology Nurse Navigator Documentation  Oncology Nurse Navigator Flowsheets 03/23/2015  Navigator Encounter Type Telephone  Patient Visit Type Follow-up  Treatment Phase Other  Barriers/Navigation Needs No barriers at this time  Interventions None required  Time Spent with Patient 15

## 2015-03-25 ENCOUNTER — Ambulatory Visit (INDEPENDENT_AMBULATORY_CARE_PROVIDER_SITE_OTHER): Payer: Medicare Other | Admitting: *Deleted

## 2015-03-25 DIAGNOSIS — C50211 Malignant neoplasm of upper-inner quadrant of right female breast: Secondary | ICD-10-CM

## 2015-03-25 NOTE — Progress Notes (Signed)
Patient came in today for a wound check.  The wound is clean, with no signs of infection noted. Follow up as scheduled next week. Drain remains more than 30 ml a day.

## 2015-03-25 NOTE — Patient Instructions (Signed)
The patient is aware to call back for any questions or concerns.  

## 2015-03-26 ENCOUNTER — Telehealth: Payer: Self-pay | Admitting: General Surgery

## 2015-03-26 NOTE — Telephone Encounter (Signed)
PT WAS CALLING TO CHECK ON PATH FROM SX ON 03-19-15.VERY ANXIOUS

## 2015-03-29 ENCOUNTER — Inpatient Hospital Stay: Payer: Medicare Other | Attending: Oncology | Admitting: Oncology

## 2015-03-29 VITALS — BP 129/61 | HR 69 | Temp 97.0°F | Wt 166.0 lb

## 2015-03-29 DIAGNOSIS — Z87891 Personal history of nicotine dependence: Secondary | ICD-10-CM

## 2015-03-29 DIAGNOSIS — Z170 Estrogen receptor positive status [ER+]: Secondary | ICD-10-CM | POA: Diagnosis not present

## 2015-03-29 DIAGNOSIS — Z9011 Acquired absence of right breast and nipple: Secondary | ICD-10-CM | POA: Insufficient documentation

## 2015-03-29 DIAGNOSIS — C50911 Malignant neoplasm of unspecified site of right female breast: Secondary | ICD-10-CM

## 2015-03-29 DIAGNOSIS — E785 Hyperlipidemia, unspecified: Secondary | ICD-10-CM | POA: Insufficient documentation

## 2015-03-29 DIAGNOSIS — Z79899 Other long term (current) drug therapy: Secondary | ICD-10-CM | POA: Diagnosis not present

## 2015-03-29 DIAGNOSIS — C50211 Malignant neoplasm of upper-inner quadrant of right female breast: Secondary | ICD-10-CM | POA: Diagnosis present

## 2015-03-29 DIAGNOSIS — C50311 Malignant neoplasm of lower-inner quadrant of right female breast: Secondary | ICD-10-CM | POA: Diagnosis not present

## 2015-03-29 DIAGNOSIS — I10 Essential (primary) hypertension: Secondary | ICD-10-CM | POA: Diagnosis not present

## 2015-03-29 DIAGNOSIS — Z7982 Long term (current) use of aspirin: Secondary | ICD-10-CM | POA: Insufficient documentation

## 2015-03-29 DIAGNOSIS — K219 Gastro-esophageal reflux disease without esophagitis: Secondary | ICD-10-CM | POA: Insufficient documentation

## 2015-03-29 LAB — SURGICAL PATHOLOGY

## 2015-03-30 ENCOUNTER — Encounter: Payer: Self-pay | Admitting: General Surgery

## 2015-03-30 ENCOUNTER — Ambulatory Visit (INDEPENDENT_AMBULATORY_CARE_PROVIDER_SITE_OTHER): Payer: Medicare Other | Admitting: General Surgery

## 2015-03-30 VITALS — BP 140/70 | HR 78 | Resp 12 | Ht 63.0 in | Wt 176.0 lb

## 2015-03-30 DIAGNOSIS — D0511 Intraductal carcinoma in situ of right breast: Secondary | ICD-10-CM

## 2015-03-30 NOTE — Progress Notes (Signed)
This is a 81 year old female here today for her post op right mastectomy done on 03/19/15. Patient states she is doing well.Drain sheet present.  Incision looks clean and sign of infection.  The drainage is about 40 ml for the last two day. Call on Thursday and let us know what the drainage is .

## 2015-03-30 NOTE — Patient Instructions (Signed)
Call on Thursday and let us know what the drainage is .

## 2015-03-31 NOTE — Addendum Note (Signed)
Addendum  created 03/31/15 1301 by Gunnar Bulla, MD   Modules edited: Anesthesia Events, Narrator   Narrator:  Narrator: Event Log Edited

## 2015-04-01 ENCOUNTER — Ambulatory Visit (INDEPENDENT_AMBULATORY_CARE_PROVIDER_SITE_OTHER): Payer: Medicare Other | Admitting: *Deleted

## 2015-04-01 DIAGNOSIS — D0511 Intraductal carcinoma in situ of right breast: Secondary | ICD-10-CM

## 2015-04-03 ENCOUNTER — Encounter: Payer: Self-pay | Admitting: Oncology

## 2015-04-03 DIAGNOSIS — K279 Peptic ulcer, site unspecified, unspecified as acute or chronic, without hemorrhage or perforation: Secondary | ICD-10-CM | POA: Insufficient documentation

## 2015-04-03 DIAGNOSIS — R000 Tachycardia, unspecified: Secondary | ICD-10-CM | POA: Insufficient documentation

## 2015-04-03 DIAGNOSIS — Z9109 Other allergy status, other than to drugs and biological substances: Secondary | ICD-10-CM | POA: Insufficient documentation

## 2015-04-03 DIAGNOSIS — R002 Palpitations: Secondary | ICD-10-CM | POA: Insufficient documentation

## 2015-04-03 NOTE — Progress Notes (Signed)
[place] @ [facility] [phone]  Fax:([phone]) [place] OB: Oct 18, 1933  MR#: [id number]  FYB#:[id number]  Patient Care Team: Idelle Crouch, MD as PCP - General (Internal Medicine) Idelle Crouch, MD (Internal Medicine) Seeplaputhur Robinette Haines, MD (General Surgery)  CHIEF COMPLAINT:  Chief Complaint  Patient presents with  . Follow-up    Oncology History   v1.  Carcinoma of right breast 2 different reasons. 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   3.  Status post right breast mastectomy With tumor size of T1 cN0 M0.  Estrogen receptor positive.  Progesterone receptor positive.  Sentinel lymph nodes were negative.  (May, 2016)     Cancer of right breast   04/03/2015 Initial Diagnosis Cancer of right breast    Oncology Flowsheet 03/19/2015  dexamethasone (DECADRON) IJ -  ondansetron (ZOFRAN) IV -  ondansetron (ZOFRAN-ODT) PO -    INTERVAL HISTORY:  81 year old lady with a history of carcinoma of breast status post right breast mastectomy.  Patient is here for ongoing evaluation and treatment consideration.  Mastectomy wound is healing well. Patient and family and number of questions regarding further therapy and planning REVIEW OF SYSTEMS:   GENERAL:  Feels good.  Active.  No fevers, sweats or weight loss. PERFORMANCE STATUS (ECOG):0 HEENT:  No visual changes, runny nose, sore throat, mouth sores or tenderness. Lungs: No shortness of breath or cough.  No hemoptysis. Cardiac:  No chest pain, palpitations, orthopnea, or PND. GI:  No nausea, vomiting, diarrhea, constipation, melena or hematochezia. GU:  No urgency, frequency, dysuria, or hematuria. Musculoskeletal:  No back pain.  No joint  pain.  No muscle tenderness. Extremities:  No pain or swelling. Skin:  No rashes or skin changes. Neuro:  No headache, numbness or weakness, balance or coordination issues. Endocrine:  No diabetes, thyroid issues, hot flashes or night sweats. Psych:  No mood changes, depression or anxiety. Pain:  No focal pain. Review of systems:  All other systems reviewed and found to be negative. Patient has been off all estrogen supplements  As per HPI. Otherwise, a complete review of systems is negatve.  PAST MEDICAL HISTORY: Past Medical History  Diagnosis Date  . Hypertension     Unspecified  . Hyperlipidemia     Mixed  . Cancer 02/16/15    Right breast  . Seasonal allergies   . GERD (gastroesophageal reflux disease)   . Elevated lipase   . Cancer of right breast 04/03/2015    PAST SURGICAL HISTORY: Past Surgical History  Procedure Laterality Date  . Breast lumpectomy Left 2012  . Breast biopsy Right 02/16/15  . Carpal tunnel release Right   . Simple mastectomy with axillary sentinel node biopsy Right 03/19/2015    Procedure: right total MASTECTOMY with sentinel node biopsy ;  Surgeon: Christene Lye, MD;  Location: [facility] ORS;  Service: General;  Laterality: Right;  . Sentinel node biopsy Right 03/19/2015    Procedure: SENTINEL NODE BIOPSY;  Surgeon: Christene Lye, MD;  Location: [facility] OR[facility];  Service: General;  Laterality: Right;  . Axillary lymph node biopsy Right 03/19/2015    Procedure: AXILLARY LYMPH NODE BIOPSY;  Surgeon: Christene Lye, MD;  Location: [facility] OR[facility];  Service: General;  Laterality: Right;    FAMILY HISTORY  Family History  Problem Relation Age of Onset  . Colon cancer Paternal Grandfather 39  . Colon cancer Paternal Uncle 20    GYNECOLOGIC HISTORY:  No LMP recorded. Patient is postmenopausal.     ADVANCED DIRECTIVES:    HEALTH MAINTENANCE: History  Substance Use Topics  . Smoking status: Former Smoker    Quit date: 11/06/1966  . Smokeless  tobacco: Never Used  . Alcohol Use: 0.0 oz/week    0 Standard drinks or equivalent per week     Comment: wine at night      No Known Allergies  Current Outpatient Prescriptions  Medication Sig Dispense Refill  . aspirin 81 MG EC tablet Take 81 mg by mouth daily.      Marland Kitchen atorvastatin (LIPITOR) 20 MG tablet Take 20 mg by mouth at bedtime.     . Calcium Carbonate-Vitamin D (CALCIUM-VITAMIN D3) 600-200 MG-UNIT TABS Take 1 tablet by mouth 2 (two) times daily.      . Multiple Vitamins-Minerals (CENTRUM) tablet Take 1 tablet by mouth daily.      . nebivolol (BYSTOLIC) 10 MG tablet TAKE 1 TABLET ONCE DAILY   PLEASE MAKE AN APPOINTMENT WITH YOUR DOCTOR    . pantoprazole (PROTONIX) 40 MG tablet Take 40 mg by mouth 2 (two) times daily.      Marland Kitchen PATADAY 0.2 % SOLN     . Calcium-Vitamin D 600-200 MG-UNIT per tablet Take by mouth.    . montelukast (SINGULAIR) 10 MG tablet Take 10 mg by mouth daily.      . norethindrone-ethinyl estradiol (FEMHRT LOW DOSE) 0.5-2.5 MG-MCG per tablet Take 1 tablet by mouth 2 (two) times daily.      . ondansetron (ZOFRAN) 4 MG tablet Take 1 tablet (4 mg total) by mouth every 8 (eight) hours as needed for nausea or vomiting. 20 tablet 0  . oxyCODONE-acetaminophen (ROXICET) 5-325 MG per tablet Take 1 tablet by mouth every 4 (four) hours as needed for severe pain. 30 tablet 0   No current facility-administered medications for this visit.    OBJECTIVE:  Filed Vitals:   03/29/15 1052  BP: 129/61  Pulse: 69  Temp: 97 F (36.1 C)     Body mass index is 29.41 kg/(m^2).    ECOG FS:0 - Asymptomatic  PHYSICAL EXAM: GENERAL:  Well developed, well nourished, sitting comfortably in the exam room in no acute distress. MENTAL STATUS:  Alert and oriented to person, place and time. HEAD  Normocephalic, atraumatic, face symmetric, no Cushingoid features. EYES:  .  Pupils equal round and reactive to light and accomodation.  No conjunctivitis or scleral icterus. ENT:  Oropharynx  clear without lesion.  Tongue normal. Mucous membranes moist.  RESPIRATORY:  Clear to auscultation without rales, wheezes or rhonchi. CARDIOVASCULAR:  Regular rate and rhythm without murmur, rub or gallop. BREAST:  Right breast : Status post mastectomy.  Wound is healing well.  Patient still has a drainage tube.  Left breast free of masses ABDOMEN:  Soft, non-tender, with active bowel sounds, and no hepatosplenomegaly.  No masses. BACK:  No CVA tenderness.  No tenderness on percussion of the back or rib cage. SKIN:  No rashes, ulcers or lesions. EXTREMITIES: No edema, no skin discoloration or tenderness.  No palpable cords. LYMPH NODES: No palpable cervical, supraclavicular, axillary or inguinal adenopathy  NEUROLOGICAL: Unremarkable. PSYCH:  Appropriate.   LAB RESULTS:  No visits with results within 3 Day(s) from this visit. Latest known visit with results is:  Admission on 03/19/2015,  Discharged on 03/19/2015  Component Date Value Ref Range Status  . SURGICAL PATHOLOGY 03/19/2015    Final                   Value:Surgical Pathology CASE: [id number] PATIENT: [name] Surgical Pathology Report     SPECIMEN SUBMITTED: A. Lymph node, sentinel #1 B. Lymph node, biopsy C. Lymph node, sentinel #2 D. Breast, right  CLINICAL HISTORY: None provided  PRE-OPERATIVE DIAGNOSIS: right breast cancer  POST-OPERATIVE DIAGNOSIS: None provided     DIAGNOSIS: A. LYMPH NODE, SENTINEL #1; BIOPSY: - ONE LYMPH NODE, NEGATIVE FOR MALIGNANCY (0/1).  B. LYMPH NODE, NON-SENTINEL; BIOPSY: - ONE LYMPH NODE, NEGATIVE FOR MALIGNANCY (0/1).  C. LYMPH NODE, SENTINEL #2; BIOPSY: - ONE LYMPH NODE, NEGATIVE FOR MALIGNANCY (0/1).  D. BREAST, RIGHT; SIMPLE MASTECTOMY: - INVASIVE MAMMARY CRIBRIFORM CARCINOMA IN 1:00 LOCATION. 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 - INTRADUCTAL PAPILLOMA. - MICROCALCIFICATIONS ARE PRESENT IN ASSOCIATION WITH DCIS, MORE  HEAVILY INVOLVING THE CARCINOMA. - NON-NEOPLASTIC BREAST TISSUE WITH APOCRINE METAPLASIA, COLUMNAR CEL                         L CHANGE AND SCLEROSING WITH ASSOCIATED CALCIFICATIONS. - UNREMARKABLE NIPPLE AND SKIN. Comment: There was a delay in the sign out of this case so that calponin and p63 immunohistochemical stains could be performed and evaluated to confirm the diagnosis of invasive cribriform carcinoma.   INVASIVE CARCINOMA OF THE BREAST: Complete Excision (Less Than Total Mastectomy, Including Specimens Designated Biopsy, Lumpectomy, Quadrantectomy, and Partial Mastectomy With or Without Axillary Contents) and Mastectomy (Total, Modified Radical, Radical With or Without Axillary Contents)Radical, Radical With or Without Axillary Contents) Breast Invasive Carcinoma Cancer Case Summary SPECIMEN Procedure:     Other (specify) simple mastectomy Lymph Node Sampling:     Sentinel lymph node(s) Other lymph nodes Other Lymph Nodes Sampled:    Other (specify) non sentinel axillary lymph node Specimen Laterality:     Right TUMOR Presence of Invasive Carcinoma:    Histologic Type of Inva                         sive Carcinoma: Invasive cribriform carcinoma Histologic Grade: Nottingham Histologic Score Glandular (Acinar) / Tubular Differentiation: Score 2: 10 to 75% of tumor area forming glandular / tubular structures Nuclear Pleomorphism: Score 2: Cells larger than normal with open vesicular nuclei, visible nucleoli, and moderate variability in both size and shape Mitotic Rate Score 1 (<=3 mitoses per mm2) Overall Grade: Grade 1: scores of 3, 4 or 5 Ductal Carcinoma In Situ (DCIS):   DCIS is present EXTENT Tumor Size / Focality:   Tumor Size: Size of Largest Invasive Carcinoma: Greatest dimension of largest focus of invasion > 1 mm Greatest Dimension (mm): 2cm Macroscopic and Microscopic Extent of Tumor Skin:     Skin Satellite Foci:     not identified Nipple DCIS:   not  identified MARGINS Invasive Carcinoma: Margins uninvolved by invasive carcinoma Distance from Closest Margin  (mm):     Distance (specify in mm) 1m Ductal Carcinoma In Situ (DCIS):   Margins uninvolv                         ed by DCIS (DCIS present in specimen) Distance of DCIS from Closest Margin (mm): Distance (specify in mm) 218mLYMPH NODES Status of Lymph Nodes:  Total Number of Lymph Nodes Examined:   Specify 3 Micro / Macro Metastases:     Not identified not identified Number of Lymph Nodes with Isolated Tumor Cells (<= 0.2 mm and <= 200 cells):   None Number of Sentinel Lymph Nodes Examined:     Specify 2 ACCESSORY FINDINGS Lymph-Vascular Invasion: Present STAGE (pTNM) TNM Descriptors:    not applicable Primary Tumor (Invasive Carcinoma) (pT): pT1c:  Tumor > 10 mm but <= 20 mm in greatest dimension Regional Lymph Nodes (pN) (choose a category based on lymph nodes received with the specimen;  immunohistochemistry and / or molecular studies are not required) Category (pN): pN0: No regional lymph node metastasis identified histologically  Breast, Biomarker Reporting Template BREAST BIOMARKER TESTS Test(s) Performed:  Estrogen Receptor (ER) Results:  POSITIVE   Range:  >9                         0% Test Type:     FDA cleared ([place]) Primary Antibody:   SP1 Progesterone Receptor (PgR) Results:  POSITIVE    Range:  >90% Test Type:     FDA cleared ([place]) Primary Antibody:   1E2 HER2 (ERBB2) by Immunohistochemistry (IHC) Results:  Negative (Score 0) Percentage of Cells with Uniform Intense Complete Membrane Staining:  0% Test Type:     Food and Drug Administration (FDA) cleared ([place]) Primary Antibody:   HercepTest METHODS Cold Ischemia and Fixation Times: Meet requirements specified in the latest version of ASCO / CAP guidelines Fixative: Formalin Image Analysis:     Not performed     GROSS DESCRIPTION:  A. Intra Operative Consultation:      Received:  fresh     Specimen: sentinel node #1     Pathologic Evaluation: frozen section     Diagnosis: lymph node, sentinel node #1: No malignancy seen     Communicated to: Dr. Jamal Collin at 10:10 AM on 03/19/2015, Hassan Buckler M.D.     Tissue submitted: 1  Labeled: sentinel node #1 Tissue Fragment(s): 1                          Measurement: 2.1 x 0.9 x 0.4 cm Comment: predominantly fatty lymph node, bisected, frozen, following frozen section formalin fixed  Entirely submitted in cassette(s): 1  B. Intra Operative Consultation:     Received:  fresh     Specimen: non- sentinel node #1     Pathologic Evaluation: frozen section     Diagnosis: lymph node non-sentinel #1: No malignancy seen     Communicated to: Dr. Jamal Collin at 10:10 AM on 03/19/2015, Hassan Buckler M.D.     Tissue submitted: 1  Labeled: non-sentinel node #1 Tissue Fragment(s): 1 Measurement: 1.7 x 0.8 x 0.5 Cm Comment: lymph node candidate, bisected, frozen, following frozen section formalin fixed  Entirely submitted in cassette(s): 1  C. Intra Operative Consultation:     Received:  fresh     Specimen: sentinel node #2     Pathologic Evaluation: frozen section     Diagnosis: lymph node, sentinel #2: No malignancy seen     Communicated to: Dr. Jamal Collin at 10:10 AM on 03/19/2015, Hassan Buckler M.D.     Tissue submitted:                          1  Labeled: sentinel node #2 Tissue Fragment(s): 1 Measurement: 1.0 x 0.5 x 0.4  cm Comment: lymph node candidate, bisected, frozen, following frozen section formalin fixed  Entirely submitted in cassette(s): 1  D. Labeled: right breast stitch on lateral  Time in Fixative: 10:10 AM on 03/19/2015 Total formalin fixation time 8 hours  Cold Ischemic Time: 29 minutes  Type of mastectomy: simple  Weight of Specimen: 665 grams  Size of specimen: 18.3 x 14.8 x 4.3 cm  Orientation of specimen: suture on lateral-marked orange, deep-black and remaining  blue  Skin ellipse dimensions  description: 16.1 x 9.2 cm  Nipple/ areola: nipple-1.5 x 1.6 cm and areola 2.9 x 2.8 cm  Axillary tail: absent  Biopsy site(s): 2, approximately 1:00 and 4 to 5:00  Discrete Mass(es):  present  Number of Discrete Masses: 2  Location of mass(es):    A-1:00 and B-4 to 5:00  Distance between masses:      5.0  cm  Size of mass(es)/biopsy site: A-2.0 x 1.5 x 1.0 cm and B-more unde                         fined 1.1 x 1.0 x 1.1 cm  Description of mass/biopsy site: firm tan with focal hemorrhage and chalky yellow  Distance of mass/biopsy site from surgical margins: A-1.4 cm from deep, 2.8 cm from skin and 2.1 cm from remaining B-2.6 cm from deep, 3.6 cm from skin and 2.5 cm from remaining  Gross involvement of skin/fascia/muscle by tumor: not grossly identified  Description of remaining breast: yellow lobulated fibrofatty with focal nodularity and cysts  Other remarkable features: none noted  Block Summary:  1-3-representative mass A (#2-area with clip) 4-tissue between masses A and B 5-perpendicular deep to mass A 6-8-mass B 9-perpendicular deep to mass B 10-representative upper outer quadrant 11-representative lower outer quadrant 12-representative upper inner quadrant 13-representative lower inner quadrant 14-representative nipple and areola              Final Diagnosis performed by Quay Burow, MD.  Electronically signed 03/29/2015 9:53:58AM                             The electronic signature indicates that the named Attending Pathologist has evaluated the specimen  Technical component performed at [facility], [address] [place], [place] [address] Lab: [phone] Dir: Darrick Penna. Evette Doffing, MD  Professional component performed at [facility], [facility], [place], [place], [place] [address] Lab: [phone] Dir: Dellia Nims. Rubinas, MD      No results found for: LABCA2 No results found for:  CA199 No results found for: CEA No results found for: PSA No results found for: CA125   STUDIES: Nm Sentinel Node Injection  03/19/2015   CLINICAL DATA:  Right breast cancer.  EXAM: NUCLEAR MEDICINE BREAST LYMPHOSCINTIGRAPHY right  TECHNIQUE: Intradermal injection of radiopharmaceutical was performed at the upper outer aspect of the right nipple. The patient was then sent to the operating room where the sentinel node(s) were identified and removed by the surgeon.  RADIOPHARMACEUTICALS:  Total of 1.04 mCi Millipore-filtered Technetium-59msulfur colloid.  IMPRESSION: Uncomplicated intradermal injection of a total of 1.04 mCi Technetium-922mulfur colloid for purposes of sentinel node identification.   Electronically Signed   By: MaInez Catalina.D.   On: 03/19/2015 08:30    ASSESSMENT: Carcinoma of right breast.  Status post mastectomy and sentinel lymph node evaluation.  Tumor size 2 cm.  Estrogen receptor and progesterone receptor positive HER-2/neu receptor  negative.  All sentinel lymph nodes were negative.  MEDICAL DECISION MAKING:  I will lab data and pathology has been reviewed I had prolonged discussion with patient and family that there is no need for any local radiation therapy Regarding systemic therapy patient certainly would benefit by addition of anti-hormonal treatment like letrozole or Aromasin.  We may consider patients for available protocols. We discussed the role of chemotherapy at present time only if patient is at high risk for recurrent disease as assessed by multi-gene analysis Patient's specimen has been sent for mammoPRINT  as patient has strongly expressed desire that if this and is at high risk C would offer a decent of chemotherapy. Total duration of visit was 45 minutes.  50% or more time was spent in counseling patient and family regarding prognosis and options of treatment and available resources  Patient expressed understanding and was in agreement with this plan.  She also understands that She can call clinic at any time with any questions, concerns, or complaints.    Cancer of right breast   Staging form: Breast, AJCC 7th Edition     Clinical: Stage IA (T1c, N0, M0) - Signed by Forest Gleason, MD on 04/03/2015   Forest Gleason, MD   04/03/2015 8:52 AM

## 2015-04-06 NOTE — Progress Notes (Signed)
Patient came in today for a wound check/drain removal.  The wound is clean, with no signs of infection noted. Drainage has been 30 ml a day for 2 days, ok to remove drain per Dr Jamal Collin. Drain removed. Follow up as scheduled.

## 2015-04-06 NOTE — Patient Instructions (Signed)
The patient is aware to call back for any questions or concerns.  

## 2015-04-14 ENCOUNTER — Ambulatory Visit: Payer: Self-pay | Admitting: Obstetrics and Gynecology

## 2015-04-14 NOTE — Progress Notes (Signed)
Notified patient of Mammoprint results - low risk.  Scheduler will call with a follow -up appointment with Dr. Oliva Bustard.  Patient states she has an appointment with Dr. Jamal Collin tomorrow.  Notified office of Mammoprint results. Oncology Nurse Navigator Documentation  Oncology Nurse Navigator Flowsheets 03/23/2015 04/14/2015  Navigator Encounter Type Telephone Telephone  Patient Visit Type Follow-up Medonc  Treatment Phase Other Other  Barriers/Navigation Needs No barriers at this time No barriers at this time  Interventions None required -  Time Spent with Patient 15 15

## 2015-04-15 ENCOUNTER — Ambulatory Visit (INDEPENDENT_AMBULATORY_CARE_PROVIDER_SITE_OTHER): Payer: Medicare Other | Admitting: General Surgery

## 2015-04-15 ENCOUNTER — Encounter: Payer: Self-pay | Admitting: General Surgery

## 2015-04-15 VITALS — BP 130/70 | HR 72 | Resp 14 | Ht 63.0 in | Wt 166.0 lb

## 2015-04-15 DIAGNOSIS — D0511 Intraductal carcinoma in situ of right breast: Secondary | ICD-10-CM

## 2015-04-15 NOTE — Patient Instructions (Signed)
The patient is aware to call back for any questions or concerns.  

## 2015-04-15 NOTE — Progress Notes (Signed)
This is a 81 year old female here today for her post op right mastectomy done on 03/19/15. Patient states she is doing well. Incision is healed and intact. No seroma noted. Excellent range of motion. Mammoprint came back with a low score. Pt does not need chemo. She has f/u with Oncology next week. Likely will start antihormonal therapy Follow up in 5 months.   PCP:  Fulton Reek

## 2015-04-29 ENCOUNTER — Inpatient Hospital Stay: Payer: Medicare Other | Attending: Oncology | Admitting: Oncology

## 2015-04-29 VITALS — BP 149/67 | HR 66 | Temp 98.2°F | Wt 169.1 lb

## 2015-04-29 DIAGNOSIS — Z9011 Acquired absence of right breast and nipple: Secondary | ICD-10-CM | POA: Insufficient documentation

## 2015-04-29 DIAGNOSIS — K219 Gastro-esophageal reflux disease without esophagitis: Secondary | ICD-10-CM | POA: Diagnosis not present

## 2015-04-29 DIAGNOSIS — C50911 Malignant neoplasm of unspecified site of right female breast: Secondary | ICD-10-CM

## 2015-04-29 DIAGNOSIS — Z87891 Personal history of nicotine dependence: Secondary | ICD-10-CM | POA: Diagnosis not present

## 2015-04-29 DIAGNOSIS — Z79811 Long term (current) use of aromatase inhibitors: Secondary | ICD-10-CM | POA: Diagnosis not present

## 2015-04-29 DIAGNOSIS — Z7982 Long term (current) use of aspirin: Secondary | ICD-10-CM

## 2015-04-29 DIAGNOSIS — E785 Hyperlipidemia, unspecified: Secondary | ICD-10-CM | POA: Diagnosis not present

## 2015-04-29 DIAGNOSIS — Z79899 Other long term (current) drug therapy: Secondary | ICD-10-CM | POA: Diagnosis not present

## 2015-04-29 DIAGNOSIS — M1990 Unspecified osteoarthritis, unspecified site: Secondary | ICD-10-CM | POA: Diagnosis not present

## 2015-04-29 DIAGNOSIS — I10 Essential (primary) hypertension: Secondary | ICD-10-CM | POA: Diagnosis not present

## 2015-04-29 DIAGNOSIS — C50211 Malignant neoplasm of upper-inner quadrant of right female breast: Secondary | ICD-10-CM | POA: Insufficient documentation

## 2015-04-29 DIAGNOSIS — Z170 Estrogen receptor positive status [ER+]: Secondary | ICD-10-CM | POA: Insufficient documentation

## 2015-04-29 DIAGNOSIS — D0592 Unspecified type of carcinoma in situ of left breast: Secondary | ICD-10-CM | POA: Insufficient documentation

## 2015-04-29 MED ORDER — LETROZOLE 2.5 MG PO TABS: 2.5000 mg | ORAL_TABLET | Freq: Every day | ORAL | Status: DC

## 2015-04-29 NOTE — Progress Notes (Signed)
Patient does have living will.  Former smoker. 

## 2015-05-03 ENCOUNTER — Encounter: Payer: Self-pay | Admitting: Oncology

## 2015-05-03 NOTE — Progress Notes (Signed)
[place] @ [facility] [phone]  Fax:([phone]) [place] OB: 1933-06-22  MR#: [id number]  AOZ#:[id number]  Patient Care Team: Idelle Crouch, MD as PCP - General (Internal Medicine) Idelle Crouch, MD (Internal Medicine) Seeplaputhur Robinette Haines, MD (General Surgery)  CHIEF COMPLAINT:  Chief Complaint  Patient presents with  . Follow-up    Oncology History   v1.  Carcinoma of right breast 2 different reasons. 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   3.  Status post right breast mastectomy With tumor size of T1 cN0 M0.  Estrogen receptor positive.  Progesterone receptor positive.  Sentinel lymph nodes were negative.  (May, 2016) " Oncotype DX Score is low.  Patient has been started on letrozole from April 29, 2015     Cancer of right breast   04/03/2015 Initial Diagnosis Cancer of right breast    Oncology Flowsheet 03/19/2015  dexamethasone (DECADRON) IJ -  ondansetron (ZOFRAN) IV -  ondansetron (ZOFRAN-ODT) PO -    INTERVAL HISTORY:  81 year old lady with a history of carcinoma of breast status post right breast mastectomy.  Patient is here for ongoing evaluation and treatment consideration.  Mastectomy wound is healing well. Patient and family and number of questions regarding further therapy and planning. April 29, 2015 Patient is here for ongoing evaluation after mastectomy patient has been evaluated for possibility of adjuvant treatment Oncotype DX score was very low. Because patient had mastectomy done does not need any radiation therapy REVIEW OF SYSTEMS:   GENERAL:  Feels good.  Active.  No fevers, sweats or weight loss. PERFORMANCE STATUS (ECOG):0 HEENT:  No visual changes, runny  nose, sore throat, mouth sores or tenderness. Lungs: No shortness of breath or cough.  No hemoptysis. Cardiac:  No chest pain, palpitations, orthopnea, or PND. GI:  No nausea, vomiting, diarrhea, constipation, melena or hematochezia. GU:  No urgency, frequency, dysuria, or hematuria. Musculoskeletal:  No back pain.  No joint pain.  No muscle tenderness. Extremities:  No pain or swelling. Skin:  No rashes or skin changes. Neuro:  No headache, numbness or weakness, balance or coordination issues. Endocrine:  No diabetes, thyroid issues, hot flashes or night sweats. Psych:  No mood changes, depression or anxiety. Pain:  No focal pain. Review of systems:  All other systems reviewed and found to be negative. Patient has been off all estrogen supplements  As per HPI. Otherwise, a complete review of systems is negatve.  PAST MEDICAL HISTORY: Past Medical History  Diagnosis Date  . Hypertension     Unspecified  . Hyperlipidemia     Mixed  . Cancer 02/16/15    Right breast  . Seasonal allergies   . GERD (gastroesophageal reflux disease)   . Elevated lipase   . Cancer of right breast 04/03/2015  . Vasomotor instability   . Reactive airway disease   . Cardiac dysrhythmia   . Peptic ulcer   . Inflammatory arthritis   . Osteoarthritis     hands, cervical spine and trigger nodules  . Lichen planus     oral and vulvar  . Vaginal atrophy     PAST SURGICAL HISTORY: Past Surgical History  Procedure Laterality Date  . Breast lumpectomy Left 2012  . Breast biopsy Right 02/16/15  . Carpal tunnel  release Right   . Simple mastectomy with axillary sentinel node biopsy Right 03/19/2015    Procedure: right total MASTECTOMY with sentinel node biopsy ;  Surgeon: Christene Lye, MD;  Location: [facility] ORS;  Service: General;  Laterality: Right;  . Sentinel node biopsy Right 03/19/2015    Procedure: SENTINEL NODE BIOPSY;  Surgeon: Christene Lye, MD;  Location: [facility] ORS;  Service: General;   Laterality: Right;  . Axillary lymph node biopsy Right 03/19/2015    Procedure: AXILLARY LYMPH NODE BIOPSY;  Surgeon: Christene Lye, MD;  Location: [facility] ORS;  Service: General;  Laterality: Right;  . Vaginoplasty    . Colon polyp remo      FAMILY HISTORY Family History  Problem Relation Age of Onset  . Colon cancer Paternal Grandfather 60  . Colon cancer Paternal Uncle 11  . Heart disease Father   . Heart disease Mother   . Diabetes Mother   . Diabetes Daughter     GYNECOLOGIC HISTORY:  No LMP recorded. Patient is postmenopausal.     ADVANCED DIRECTIVES: Patient does have living will   HEALTH MAINTENANCE: History  Substance Use Topics  . Smoking status: Former Smoker    Quit date: 11/06/1966  . Smokeless tobacco: Never Used  . Alcohol Use: 0.0 oz/week    0 Standard drinks or equivalent per week     Comment: wine at night      No Known Allergies  Current Outpatient Prescriptions  Medication Sig Dispense Refill  . aspirin 81 MG EC tablet Take 81 mg by mouth daily.      Marland Kitchen atorvastatin (LIPITOR) 20 MG tablet Take 20 mg by mouth at bedtime.     . Calcium Carbonate-Vitamin D (CALCIUM-VITAMIN D3) 600-200 MG-UNIT TABS Take 1 tablet by mouth 2 (two) times daily.      Marland Kitchen estradiol (ESTRACE) 0.1 MG/GM vaginal cream Place 1 Applicatorful vaginally 2 (two) times a week.    . Multiple Vitamins-Minerals (CENTRUM) tablet Take 1 tablet by mouth daily.      . nebivolol (BYSTOLIC) 10 MG tablet TAKE 1 TABLET ONCE DAILY   PLEASE MAKE AN APPOINTMENT WITH YOUR DOCTOR    . pantoprazole (PROTONIX) 40 MG tablet Take 40 mg by mouth 2 (two) times daily.      Marland Kitchen PATADAY 0.2 % SOLN     . Calcium-Vitamin D 600-200 MG-UNIT per tablet Take by mouth.    . clobetasol ointment (TEMOVATE) 4.12 % Apply 1 application topically 2 (two) times a week.    . letrozole (FEMARA) 2.5 MG tablet Take 1 tablet (2.5 mg total) by mouth daily. 30 tablet 6  . montelukast (SINGULAIR) 10 MG tablet Take 10 mg by  mouth daily.      . norethindrone-ethinyl estradiol (FEMHRT LOW DOSE) 0.5-2.5 MG-MCG per tablet Take 1 tablet by mouth 2 (two) times daily.      . ondansetron (ZOFRAN) 4 MG tablet Take 1 tablet (4 mg total) by mouth every 8 (eight) hours as needed for nausea or vomiting. (Patient not taking: Reported on 04/29/2015) 20 tablet 0   No current facility-administered medications for this visit.    OBJECTIVE:  Filed Vitals:   04/29/15 1041  BP: 149/67  Pulse: 66  Temp: 98.2 F (36.8 C)     Body mass index is 29.96 kg/(m^2).    ECOG FS:0 - Asymptomatic  PHYSICAL EXAM: GENERAL:  Well developed, well nourished, sitting comfortably in the exam room in no acute distress. MENTAL STATUS:  Alert and  oriented to person, place and time. HEAD  Normocephalic, atraumatic, face symmetric, no Cushingoid features. EYES:  .  Pupils equal round and reactive to light and accomodation.  No conjunctivitis or scleral icterus. ENT:  Oropharynx clear without lesion.  Tongue normal. Mucous membranes moist.  RESPIRATORY:  Clear to auscultation without rales, wheezes or rhonchi. CARDIOVASCULAR:  Regular rate and rhythm without murmur, rub or gallop. BREAST:  Right breast : Status post mastectomy.  Wound is healing well.  Patient still has a drainage tube.  Left breast free of masses ABDOMEN:  Soft, non-tender, with active bowel sounds, and no hepatosplenomegaly.  No masses. BACK:  No CVA tenderness.  No tenderness on percussion of the back or rib cage. SKIN:  No rashes, ulcers or lesions. EXTREMITIES: No edema, no skin discoloration or tenderness.  No palpable cords. LYMPH NODES: No palpable cervical, supraclavicular, axillary or inguinal adenopathy  NEUROLOGICAL: Unremarkable. PSYCH:  Appropriate.   LAB RESULTS:  No visits with results within 3 Day(s) from this visit. Latest known visit with results is:  Abstract on 04/12/2015  Component Date Value Ref Range Status  . HM Mammogram 11/06/2012 normal   Final    . HM Pap smear 06/06/2012 normal   Final  . HM Colonoscopy 11/06/2012 normal    Final  . HM Dexa Scan 11/06/2010 osteopenia   Final      STUDIES: No results found.  ASSESSMENT: Carcinoma of right breast.  Status post mastectomy and sentinel lymph node evaluation.  Tumor size 2 cm.  Estrogen receptor and progesterone receptor positive HER-2/neu receptor negative.  All sentinel lymph nodes were negative..  Mammogram pain was low risk  MEDICAL DECISION MAKING:  Carcinoma of breast stage II with 2 cm tumor estrogen receptor positive progesterone receptor positive Mammogram shows a low risk Discussed that findings with the patient.  Patient and number of questions which were answered.  Patient was started on letrozole was advised to take vitamin D and calcium patient had need have bone density study done at primary care physician office which will be evaluated.  Patient was advised to call me if develops any bony pain.  Re-scope osteoporosis has been discussed Total duration of visit was 45 minutes.  50% or more time was spent in counseling patient and family regarding prognosis and options of treatment and available resources  Patient expressed understanding and was in agreement with this plan. She also understands that She can call clinic at any time with any questions, concerns, or complaints.    Cancer of right breast   Staging form: Breast, AJCC 7th Edition     Clinical: Stage IA (T1c, N0, M0) - Signed by Forest Gleason, MD on 04/03/2015   Forest Gleason, MD   05/03/2015 8:37 AM

## 2015-05-18 ENCOUNTER — Encounter: Payer: Self-pay | Admitting: Obstetrics and Gynecology

## 2015-05-18 ENCOUNTER — Ambulatory Visit (INDEPENDENT_AMBULATORY_CARE_PROVIDER_SITE_OTHER): Payer: Medicare Other | Admitting: Obstetrics and Gynecology

## 2015-05-18 VITALS — BP 143/79 | HR 62 | Ht 64.0 in | Wt 167.6 lb

## 2015-05-18 DIAGNOSIS — C50911 Malignant neoplasm of unspecified site of right female breast: Secondary | ICD-10-CM

## 2015-05-18 DIAGNOSIS — L439 Lichen planus, unspecified: Secondary | ICD-10-CM

## 2015-05-18 DIAGNOSIS — N952 Postmenopausal atrophic vaginitis: Secondary | ICD-10-CM | POA: Diagnosis not present

## 2015-05-18 MED ORDER — CLOBETASOL PROPIONATE 0.05 % EX OINT: 1.0000 "application " | TOPICAL_OINTMENT | CUTANEOUS | Status: DC

## 2015-05-18 MED ORDER — ESTRADIOL 0.1 MG/GM VA CREA: 1.0000 | TOPICAL_CREAM | VAGINAL | Status: DC

## 2015-05-18 NOTE — Patient Instructions (Signed)
1, STOP Estrace. 2. Continue clobetesol twice weekly 3. Return in 4 months for check.

## 2015-05-18 NOTE — Progress Notes (Signed)
Patient ID: [name], female   DOB: 1933/08/19, 81 y.o.   MRN: [id number]   GYN ENCOUNTER NOTE  Subjective:       [name] is a 81 y.o. [phone] female is here for gynecologic evaluation of the following issues:  1. Lichen planus. 2.  Vaginal atrophy. 3.  Recent diagnosis of breast cancer status post modified  right radical mastectomy.  ER/PR positive   Pt for f/u (8 months) for Lichens Planus and Vaginal Atrophy.  Refills printed per pt request. Pt has had right breast mastectomy on 03/19/15 by Dr. Jamal Collin. No chemo or radiation needed. Taking Letrozole 2.5mg . Daily.  Patient is still using Premarin vaginal cream for vaginal atrophy.  She has discontinued her HRT following diagnosis of breast cancer. Lichen planus/vaginal atrophy symptoms are stable.   Gynecologic History No LMP recorded. Patient is postmenopausal. Contraception: post menopausal status  Obstetric History OB History  Gravida Para Term Preterm AB SAB TAB Ectopic Multiple Living  4 3 3  0 1 1 0 0 0 3    # Outcome Date GA Lbr Len/2nd Weight Sex Delivery Anes PTL Lv  4 Term      Vag-Spont   Y  3 Term      Vag-Spont   Y  2 Term      Vag-Spont   Y  1 SAB         FD    Obstetric Comments  1st Menstrual Cycle:  17  1st Pregnancy:  25    Past Medical History  Diagnosis Date  . Hypertension     Unspecified  . Hyperlipidemia     Mixed  . Cancer 02/16/15    Right breast  . Seasonal allergies   . GERD (gastroesophageal reflux disease)   . Elevated lipase   . Cancer of right breast 04/03/2015  . Vasomotor instability   . Reactive airway disease   . Cardiac dysrhythmia   . Peptic ulcer   . Inflammatory arthritis   . Osteoarthritis     hands, cervical spine and trigger nodules  . Lichen planus     oral and vulvar  . Vaginal atrophy     Past Surgical History  Procedure Laterality Date  . Breast lumpectomy Left 2012  . Breast biopsy Right 02/16/15  . Carpal tunnel release Right   . Simple mastectomy  with axillary sentinel node biopsy Right 03/19/2015    Procedure: right total MASTECTOMY with sentinel node biopsy ;  Surgeon: Christene Lye, MD;  Location: [facility] ORS;  Service: General;  Laterality: Right;  . Sentinel node biopsy Right 03/19/2015    Procedure: SENTINEL NODE BIOPSY;  Surgeon: Christene Lye, MD;  Location: [facility] OR[facility];  Service: General;  Laterality: Right;  . Axillary lymph node biopsy Right 03/19/2015    Procedure: AXILLARY LYMPH NODE BIOPSY;  Surgeon: Christene Lye, MD;  Location: [facility] ORS;  Service: General;  Laterality: Right;  . Vaginoplasty    . Colon polyp remo      Current Outpatient Prescriptions on File Prior to Visit  Medication Sig Dispense Refill  . aspirin 81 MG EC tablet Take 81 mg by mouth daily.      Marland Kitchen atorvastatin (LIPITOR) 20 MG tablet Take 20 mg by mouth at bedtime.     . Calcium Carbonate-Vitamin D (CALCIUM-VITAMIN D3) 600-200 MG-UNIT TABS Take 1 tablet by mouth 2 (two) times daily.      Marland Kitchen letrozole (FEMARA) 2.5 MG tablet Take 1 tablet (2.5 mg  total) by mouth daily. 30 tablet 6  . Multiple Vitamins-Minerals (CENTRUM) tablet Take 1 tablet by mouth daily.      . nebivolol (BYSTOLIC) 10 MG tablet TAKE 1 TABLET ONCE DAILY   PLEASE MAKE AN APPOINTMENT WITH YOUR DOCTOR    . pantoprazole (PROTONIX) 40 MG tablet Take 40 mg by mouth 2 (two) times daily.      Marland Kitchen PATADAY 0.2 % SOLN     . Calcium-Vitamin D 600-200 MG-UNIT per tablet Take by mouth.    . ondansetron (ZOFRAN) 4 MG tablet Take 1 tablet (4 mg total) by mouth every 8 (eight) hours as needed for nausea or vomiting. (Patient not taking: Reported on 04/29/2015) 20 tablet 0   No current facility-administered medications on file prior to visit.    No Known Allergies  History   Social History  . Marital Status: Married    Spouse Name: N/A  . Number of Children: N/A  . Years of Education: N/A   Occupational History  . Retired    Social History Main Topics  . Smoking status: Former  Smoker    Quit date: 11/06/1966  . Smokeless tobacco: Never Used  . Alcohol Use: 0.0 oz/week    0 Standard drinks or equivalent per week     Comment: wine at night  . Drug Use: No  . Sexual Activity: Not on file   Other Topics Concern  . Not on file   Social History Narrative   Pt gets regular exercise.   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    She is the wife of Dr. Lorin Picket Leaming's twin brother.    Family History  Problem Relation Age of Onset  . Colon cancer Paternal Grandfather 92  . Colon cancer Paternal Uncle 89  . Heart disease Father   . Heart disease Mother   . Diabetes Mother   . Diabetes Daughter     The following portions of the patient's history were reviewed and updated as appropriate: allergies, current medications, past family history, past medical history, past social history, past surgical history and problem list.  Review of Systems Review of Systems - General ROS: negative for - chills, fatigue, fever, hot flashes, malaise or night sweats Hematological and Lymphatic ROS: negative for - bleeding problems or swollen lymph nodes Gastrointestinal ROS: negative for - abdominal pain, blood in stools, change in bowel habits and nausea/vomiting Musculoskeletal ROS: negative for - joint pain, muscle pain or muscular weakness Genito-Urinary ROS: negative for -  genital discharge, genital ulcers, hematuria, incontinence,Pelvic pain  Objective:   BP 143/79 mmHg  Pulse 62  Ht 5\' 4"  (1.626 m)  Wt 167 lb 9 oz (76.006 kg)  BMI 28.75 kg/m2 CONSTITUTIONAL: Well-developed, well-nourished female in no acute distress.  HENT:  Normocephalic, atraumatic.  SKIN: Skin is warm and dry. No rash noted. Not diaphoretic. No erythema. No pallor. [place]: Alert and oriented to person, place, and time. PSYCHIATRIC: Normal mood and affect. Normal behavior. Normal judgment and thought content. CARDIOVASCULAR:Not Examined RESPIRATORY: Not Examined BREASTS: Not  Examined ABDOMEN: Soft, non distended; Non tender.  No Organomegaly. PELVIC:  External Genitalia: Atrophy; No significant erosion present  BUS: Normal  Vagina: Atrophic; no significant discharge  Cervix: Not visualized  Uterus: Bimanual not done  Adnexa: Not palpated  RV: Normal External exam  Bladder: Nontender     Assessment:   1.  Lichen planus, stable. 2.  Vaginal atrophy, stable.  Patient has been using vaginal estrogen cream; she  is counseled to discontinue the medication because of her recent breast cancer diagnosis and breast cancer Receptor status (ER/PR positive)   Plan:   1.  Continue with the Temovate ointment 2-3 times a week. 2.  Discontinue vaginal estrogen therapy. 3.  Return in 4 months for follow-up

## 2015-05-27 ENCOUNTER — Encounter: Payer: Self-pay | Admitting: Oncology

## 2015-05-27 ENCOUNTER — Telehealth: Payer: Self-pay | Admitting: *Deleted

## 2015-05-27 DIAGNOSIS — C50911 Malignant neoplasm of unspecified site of right female breast: Secondary | ICD-10-CM

## 2015-05-27 MED ORDER — LETROZOLE 2.5 MG PO TABS: 2.5000 mg | ORAL_TABLET | Freq: Every day | ORAL | Status: DC

## 2015-05-27 NOTE — Telephone Encounter (Signed)
Pt leaving for vacation and needs refill on letrozole. 30 day supply sent to [facility] on [address] and then 5 refills sent to mail order pharmacy for future med refills. Pt informed by Al Pimple, RN.

## 2015-07-01 ENCOUNTER — Other Ambulatory Visit: Payer: Self-pay | Admitting: Oncology

## 2015-07-28 ENCOUNTER — Ambulatory Visit
Admission: RE | Admit: 2015-07-28 | Discharge: 2015-07-28 | Disposition: A | Discharge status: Home / Self Care | Payer: Medicare Other | Source: Ambulatory Visit | Attending: Unknown Physician Specialty | Admitting: Unknown Physician Specialty

## 2015-07-28 ENCOUNTER — Ambulatory Visit: Payer: Medicare Other | Admitting: Anesthesiology

## 2015-07-28 ENCOUNTER — Encounter: Payer: Self-pay | Admitting: *Deleted

## 2015-07-28 ENCOUNTER — Encounter
Admission: RE | Disposition: A | Discharge status: Home / Self Care | Payer: Self-pay | Source: Ambulatory Visit | Attending: Unknown Physician Specialty

## 2015-07-28 DIAGNOSIS — M1990 Unspecified osteoarthritis, unspecified site: Secondary | ICD-10-CM | POA: Insufficient documentation

## 2015-07-28 DIAGNOSIS — Z86010 Personal history of colonic polyps: Secondary | ICD-10-CM | POA: Diagnosis not present

## 2015-07-28 DIAGNOSIS — K219 Gastro-esophageal reflux disease without esophagitis: Secondary | ICD-10-CM | POA: Diagnosis not present

## 2015-07-28 DIAGNOSIS — I10 Essential (primary) hypertension: Secondary | ICD-10-CM | POA: Diagnosis not present

## 2015-07-28 DIAGNOSIS — N952 Postmenopausal atrophic vaginitis: Secondary | ICD-10-CM | POA: Insufficient documentation

## 2015-07-28 DIAGNOSIS — Z79899 Other long term (current) drug therapy: Secondary | ICD-10-CM | POA: Diagnosis not present

## 2015-07-28 DIAGNOSIS — M1380 Other specified arthritis, unspecified site: Secondary | ICD-10-CM | POA: Diagnosis not present

## 2015-07-28 DIAGNOSIS — Z800 Family history of malignant neoplasm of digestive organs: Secondary | ICD-10-CM | POA: Insufficient documentation

## 2015-07-28 DIAGNOSIS — Z853 Personal history of malignant neoplasm of breast: Secondary | ICD-10-CM | POA: Insufficient documentation

## 2015-07-28 DIAGNOSIS — M19041 Primary osteoarthritis, right hand: Secondary | ICD-10-CM | POA: Diagnosis not present

## 2015-07-28 DIAGNOSIS — Z8711 Personal history of peptic ulcer disease: Secondary | ICD-10-CM | POA: Insufficient documentation

## 2015-07-28 DIAGNOSIS — K2270 Barrett's esophagus without dysplasia: Secondary | ICD-10-CM | POA: Diagnosis not present

## 2015-07-28 DIAGNOSIS — Z87891 Personal history of nicotine dependence: Secondary | ICD-10-CM | POA: Insufficient documentation

## 2015-07-28 DIAGNOSIS — E785 Hyperlipidemia, unspecified: Secondary | ICD-10-CM | POA: Diagnosis not present

## 2015-07-28 DIAGNOSIS — R748 Abnormal levels of other serum enzymes: Secondary | ICD-10-CM | POA: Diagnosis not present

## 2015-07-28 DIAGNOSIS — M19042 Primary osteoarthritis, left hand: Secondary | ICD-10-CM | POA: Diagnosis not present

## 2015-07-28 DIAGNOSIS — J45909 Unspecified asthma, uncomplicated: Secondary | ICD-10-CM | POA: Diagnosis not present

## 2015-07-28 DIAGNOSIS — Z7982 Long term (current) use of aspirin: Secondary | ICD-10-CM | POA: Diagnosis not present

## 2015-07-28 DIAGNOSIS — M479 Spondylosis, unspecified: Secondary | ICD-10-CM | POA: Diagnosis not present

## 2015-07-28 HISTORY — DX: Cough: R05

## 2015-07-28 HISTORY — PX: ESOPHAGOGASTRODUODENOSCOPY (EGD) WITH PROPOFOL: SHX5813

## 2015-07-28 HISTORY — DX: Chronic cough: R05.3

## 2015-07-28 HISTORY — DX: Endocarditis, valve unspecified: I38

## 2015-07-28 SURGERY — ESOPHAGOGASTRODUODENOSCOPY (EGD) WITH PROPOFOL
Anesthesia: General

## 2015-07-28 MED ORDER — ALFENTANIL 500 MCG/ML IJ INJ
INJECTION | INTRAMUSCULAR | Status: DC | PRN
  Administered 2015-07-28: 500 ug via INTRAVENOUS

## 2015-07-28 MED ORDER — SODIUM CHLORIDE 0.9 % IV SOLN
INTRAVENOUS | Status: DC
  Administered 2015-07-28: 10:00:00 via INTRAVENOUS

## 2015-07-28 MED ORDER — GLYCOPYRROLATE 0.2 MG/ML IJ SOLN
INTRAMUSCULAR | Status: DC | PRN
  Administered 2015-07-28: 0.2 mg via INTRAVENOUS

## 2015-07-28 MED ORDER — LIDOCAINE HCL (CARDIAC) 20 MG/ML IV SOLN
INTRAVENOUS | Status: DC | PRN
  Administered 2015-07-28: 100 mg via INTRAVENOUS

## 2015-07-28 MED ORDER — PROPOFOL 10 MG/ML IV BOLUS
INTRAVENOUS | Status: DC | PRN
  Administered 2015-07-28: 20 mg via INTRAVENOUS

## 2015-07-28 MED ORDER — SODIUM CHLORIDE 0.9 % IV SOLN
INTRAVENOUS | Status: DC
Start: 2015-07-28 — End: 2015-07-28

## 2015-07-28 MED ORDER — PROPOFOL INFUSION 10 MG/ML OPTIME
INTRAVENOUS | Status: DC | PRN
  Administered 2015-07-28: 100 ug/kg/min via INTRAVENOUS

## 2015-07-28 NOTE — Anesthesia Preprocedure Evaluation (Signed)
Anesthesia Evaluation  Patient identified by MRN, date of birth, ID band Patient awake    Reviewed: Allergy & Precautions, H&P , NPO status , Patient's Chart, lab work & pertinent test results  Airway Mallampati: III  TM Distance: >3 FB Neck ROM: limited    Dental no notable dental hx. (+) Teeth Intact   Pulmonary former smoker,    Pulmonary exam normal breath sounds clear to auscultation       Cardiovascular Exercise Tolerance: Good hypertension, (-) Past MI Normal cardiovascular exam+ dysrhythmias  Rhythm:regular Rate:Normal     Neuro/Psych negative neurological ROS  negative psych ROS   GI/Hepatic Neg liver ROS, PUD, GERD  Controlled,  Endo/Other  negative endocrine ROS  Renal/GU negative Renal ROS  negative genitourinary   Musculoskeletal  (+) Arthritis ,   Abdominal   Peds  Hematology negative hematology ROS (+)   Anesthesia Other Findings Past Medical History:   Hypertension                                                   Comment:Unspecified   Hyperlipidemia                                                 Comment:Mixed   Cancer                                          02/16/15        Comment:Right breast   Seasonal allergies                                           GERD (gastroesophageal reflux disease)                       Elevated lipase                                              Cancer of right breast                          04/03/2015    Vasomotor instability                                        Reactive airway disease                                      Cardiac dysrhythmia                                          Peptic ulcer  Inflammatory arthritis                                       Osteoarthritis                                                 Comment:hands, cervical spine and trigger nodules   Lichen planus                                                   Comment:oral and vulvar   Vaginal atrophy                                              Valvular disease                                             Chronic cough                                                Reproductive/Obstetrics negative OB ROS                             Anesthesia Physical Anesthesia Plan  ASA: III  Anesthesia Plan: General   Post-op Pain Management:    Induction:   Airway Management Planned:   Additional Equipment:   Intra-op Plan:   Post-operative Plan:   Informed Consent: I have reviewed the patients History and Physical, chart, labs and discussed the procedure including the risks, benefits and alternatives for the proposed anesthesia with the patient or authorized representative who has indicated his/her understanding and acceptance.   Dental Advisory Given  Plan Discussed with: Anesthesiologist, CRNA and Surgeon  Anesthesia Plan Comments: (Requested that patient remove contacts to protect eyes)        Anesthesia Quick Evaluation

## 2015-07-28 NOTE — Op Note (Signed)
[facility] Gastroenterology Patient Name: [name] Procedure Date: 07/28/2015 10:07 AM MRN: [id number] Account #: [id number] Date of Birth: 11/17/1932 Admit Type: Outpatient Age: 81 Room: [facility] ENDO ROOM 4 Gender: Female Note Status: Finalized Procedure:         Upper GI endoscopy Indications:       Follow-up of Barrett's esophagus Providers:         Manya Silvas, MD Referring MD:      Leonie Douglas. Doy Hutching, MD (Referring MD) Medicines:         Propofol per Anesthesia Complications:     No immediate complications. Procedure:         Pre-Anesthesia Assessment:                    - After reviewing the risks and benefits, the patient was                     deemed in satisfactory condition to undergo the procedure.                    After obtaining informed consent, the endoscope was passed                     under direct vision. Throughout the procedure, the                     patient's blood pressure, pulse, and oxygen saturations                     were monitored continuously. The Endoscope was introduced                     through the mouth, and advanced to the second part of                     duodenum. The upper GI endoscopy was accomplished without                     difficulty. The patient tolerated the procedure well. Findings:      There were esophageal mucosal changes secondary to established       short-segment Barrett's disease present at the gastroesophageal       junction. The maximum longitudinal extent of these mucosal changes was 1       cm in length. Mucosa was biopsied with a cold forceps for histology. One       specimen bottle was sent to pathology. The area looked unchanged frrom       before.      The stomach was normal.      The examined duodenum was normal. Impression:        - Esophageal mucosal changes secondary to established                     short-segment Barrett's disease. Biopsied.                    - Normal  stomach.                    - Normal examined duodenum. Recommendation:    - Await pathology results. Manya Silvas, MD 07/28/2015 10:32:54 AM This report has been signed electronically. Number of Addenda: 0 Note Initiated On: 07/28/2015 10:07 AM      [facility]

## 2015-07-28 NOTE — Anesthesia Postprocedure Evaluation (Signed)
  Anesthesia Post-op Note  Patient: [name]  Procedure(s) Performed: Procedure(s): ESOPHAGOGASTRODUODENOSCOPY (EGD) WITH PROPOFOL (N/A)  Anesthesia type:General  Patient location: PACU  Post pain: Pain level controlled  Post assessment: Post-op Vital signs reviewed, Patient's Cardiovascular Status Stable, Respiratory Function Stable, Patent Airway and No signs of Nausea or vomiting  Post vital signs: Reviewed and stable  Last Vitals:  Filed Vitals:   07/28/15 1105  BP: 139/76  Pulse: 80  Temp:   Resp: 20    Level of consciousness: awake, alert  and patient cooperative  Complications: No apparent anesthesia complications

## 2015-07-28 NOTE — Transfer of Care (Signed)
Immediate Anesthesia Transfer of Care Note  Patient: [name]  Procedure(s) Performed: Procedure(s): ESOPHAGOGASTRODUODENOSCOPY (EGD) WITH PROPOFOL (N/A)  Patient Location: PACU  Anesthesia Type:General  Level of Consciousness: awake, alert , oriented and patient cooperative  Airway & Oxygen Therapy: Patient Spontanous Breathing and Patient connected to nasal cannula oxygen  Post-op Assessment: Report given to RN and Post -op Vital signs reviewed and stable  Post vital signs: Reviewed and stable  Last Vitals:  Filed Vitals:   07/28/15 1036  BP: 141/77  Pulse:   Temp: 36.6 C  Resp: 19    Complications: No apparent anesthesia complications

## 2015-07-28 NOTE — H&P (Signed)
Primary Care Physician:  Idelle Crouch, MD Primary Gastroenterologist:  Dr. Vira Agar  Pre-Procedure History & Physical: HPI:  [name] is a 81 y.o. female is here for an endoscopy.   Past Medical History  Diagnosis Date  . Hypertension     Unspecified  . Hyperlipidemia     Mixed  . Cancer 02/16/15    Right breast  . Seasonal allergies   . GERD (gastroesophageal reflux disease)   . Elevated lipase   . Cancer of right breast 04/03/2015  . Vasomotor instability   . Reactive airway disease   . Cardiac dysrhythmia   . Peptic ulcer   . Inflammatory arthritis   . Osteoarthritis     hands, cervical spine and trigger nodules  . Lichen planus     oral and vulvar  . Vaginal atrophy   . Valvular disease   . Chronic cough     Past Surgical History  Procedure Laterality Date  . Breast lumpectomy Left 2012  . Breast biopsy Right 02/16/15  . Carpal tunnel release Right   . Simple mastectomy with axillary sentinel node biopsy Right 03/19/2015    Procedure: right total MASTECTOMY with sentinel node biopsy ;  Surgeon: Christene Lye, MD;  Location: [facility];  Service: General;  Laterality: Right;  . Sentinel node biopsy Right 03/19/2015    Procedure: SENTINEL NODE BIOPSY;  Surgeon: Christene Lye, MD;  Location: [facility];  Service: General;  Laterality: Right;  . Axillary lymph node biopsy Right 03/19/2015    Procedure: AXILLARY LYMPH NODE BIOPSY;  Surgeon: Christene Lye, MD;  Location: [facility];  Service: General;  Laterality: Right;  . Vaginoplasty    . Colon polyp remo      Prior to Admission medications   Medication Sig Start Date End Date Taking? Authorizing Provider  aspirin 81 MG EC tablet Take 81 mg by mouth daily.     Yes Historical Provider, MD  Calcium-Vitamin D 600-200 MG-UNIT per tablet Take by mouth.   Yes Historical Provider, MD  Multiple Vitamins-Minerals (CENTRUM) tablet Take 1 tablet by mouth daily.     Yes Historical Provider, MD   nebivolol (BYSTOLIC) 10 MG tablet TAKE 1 TABLET ONCE DAILY   PLEASE MAKE AN APPOINTMENT WITH YOUR DOCTOR 01/14/15  Yes Historical Provider, MD  pantoprazole (PROTONIX) 40 MG tablet Take 40 mg by mouth 2 (two) times daily.     Yes Historical Provider, MD  atorvastatin (LIPITOR) 20 MG tablet Take 20 mg by mouth at bedtime.     Historical Provider, MD  Calcium Carbonate-Vitamin D (CALCIUM-VITAMIN D3) 600-200 MG-UNIT TABS Take 1 tablet by mouth 2 (two) times daily.      Historical Provider, MD  clobetasol ointment (TEMOVATE) 0.35 % Apply 1 application topically 2 (two) times a week. 05/18/15   Alanda Slim Defrancesco, MD  estradiol (ESTRACE) 0.1 MG/GM vaginal cream Place 1 Applicatorful vaginally 2 (two) times a week. 05/18/15   Alanda Slim Defrancesco, MD  letrozole [facility]) 2.5 MG tablet Take 1 tablet (2.5 mg total) by mouth daily. 05/27/15   Forest Gleason, MD  letrozole ([place]) 2.5 MG tablet TAKE 1 TABLET (2.5 MG TOTAL) BY MOUTH DAILY. 07/01/15   Forest Gleason, MD  ondansetron (ZOFRAN) 4 MG tablet Take 1 tablet (4 mg total) by mouth every 8 (eight) hours as needed for nausea or vomiting. Patient not taking: Reported on 04/29/2015 03/19/15   Christene Lye, MD  PATADAY 0.2 % SOLN  01/18/15   Historical  Provider, MD    Allergies as of 07/14/2015  . (No Known Allergies)    Family History  Problem Relation Age of Onset  . Colon cancer Paternal Grandfather 72  . Colon cancer Paternal Uncle 30  . Heart disease Father   . Heart disease Mother   . Diabetes Mother   . Diabetes Daughter     Social History   Social History  . Marital Status: Married    Spouse Name: N/A  . Number of Children: N/A  . Years of Education: N/A   Occupational History  . Retired    Social History Main Topics  . Smoking status: Former Smoker    Quit date: 11/06/1966  . Smokeless tobacco: Never Used  . Alcohol Use: 3.0 oz/week    0 Standard drinks or equivalent, 5 Glasses of wine per week     Comment: wine at night   . Drug Use: No  . Sexual Activity: Not on file   Other Topics Concern  . Not on file   Social History Narrative   Pt gets regular exercise.   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    She is the wife of Dr. Lorin Picket Kilbride's twin brother.    Review of Systems: See HPI, otherwise negative ROS  Physical Exam: BP 140/64 mmHg  Pulse 63  Temp(Src) 99 F (37.2 C) (Tympanic)  Resp 20  Ht 5\' 4"  (1.626 m)  Wt 75.751 kg (167 lb)  BMI 28.65 kg/m2  SpO2 99% General:   Alert,  pleasant and cooperative in NAD Head:  Normocephalic and atraumatic. Neck:  Supple; no masses or thyromegaly. Lungs:  Clear throughout to auscultation.    Heart:  Regular rate and rhythm. Abdomen:  Soft, nontender and nondistended. Normal bowel sounds, without guarding, and without rebound.   Neurologic:  Alert and  oriented x4;  grossly normal neurologically.  Impression/Plan: [name] is here for an endoscopy to be performed for Barretts esophagus follow up  Risks, benefits, limitations, and alternatives regarding  endoscopy have been reviewed with the patient.  Questions have been answered.  All parties agreeable.   Gaylyn Cheers, MD  07/28/2015, 10:19 AM

## 2015-07-29 ENCOUNTER — Encounter: Payer: Self-pay | Admitting: Unknown Physician Specialty

## 2015-07-29 LAB — SURGICAL PATHOLOGY

## 2015-07-31 ENCOUNTER — Other Ambulatory Visit: Payer: Self-pay | Admitting: Oncology

## 2015-09-13 ENCOUNTER — Encounter: Payer: Self-pay | Admitting: General Surgery

## 2015-09-13 ENCOUNTER — Ambulatory Visit (INDEPENDENT_AMBULATORY_CARE_PROVIDER_SITE_OTHER): Payer: Medicare Other | Admitting: General Surgery

## 2015-09-13 VITALS — BP 130/74 | HR 76 | Resp 12 | Ht 63.0 in | Wt 168.0 lb

## 2015-09-13 DIAGNOSIS — D0511 Intraductal carcinoma in situ of right breast: Secondary | ICD-10-CM

## 2015-09-13 NOTE — Patient Instructions (Signed)
Continue self breast exams. Call office for any new breast issues or concerns. Diagnostic left breast mammogram in 4 months. Follow up with Dr. Oliva Bustard as scheduled.

## 2015-09-13 NOTE — Progress Notes (Signed)
Patient ID: [name], female   DOB: [birth year], 81 y.o.   MRN: [id number]  Chief Complaint  Patient presents with  . Follow-up    5 month right mastectomy    HPI [name] is a 81 y.o. female here today for her 5 month follow up right mastectomy. Patient states she is doing well. Patient is currently still on Letrozole and doing well other than intermittent hot flashes.   I have reviewed the history of present illness with the patient. HPI  Past Medical History  Diagnosis Date  . Hypertension     Unspecified  . Hyperlipidemia     Mixed  . Seasonal allergies   . GERD (gastroesophageal reflux disease)   . Elevated lipase   . Vasomotor instability   . Reactive airway disease   . Cardiac dysrhythmia   . Peptic ulcer   . Inflammatory arthritis ([place])   . Osteoarthritis     hands, cervical spine and trigger nodules  . Lichen planus     oral and vulvar  . Vaginal atrophy   . Valvular disease   . Chronic cough   . Cancer ([place]) 02/16/15    Right breast, T1c, NO, MO; ER/PR pos. Her 2 neg    Past Surgical History  Procedure Laterality Date  . Breast lumpectomy Left 2012  . Breast biopsy Right 02/16/15  . Carpal tunnel release Right   . Simple mastectomy with axillary sentinel node biopsy Right 03/19/2015    Procedure: right total MASTECTOMY with sentinel node biopsy ;  Surgeon: Christene Lye, MD;  Location: [facility];  Service: General;  Laterality: Right;  . Sentinel node biopsy Right 03/19/2015    Procedure: SENTINEL NODE BIOPSY;  Surgeon: Christene Lye, MD;  Location: [facility];  Service: General;  Laterality: Right;  . Axillary lymph node biopsy Right 03/19/2015    Procedure: AXILLARY LYMPH NODE BIOPSY;  Surgeon: Christene Lye, MD;  Location: [facility];  Service: General;  Laterality: Right;  . Vaginoplasty    . Colon polyp remo    . Esophagogastroduodenoscopy (egd) with propofol N/A 07/28/2015    Procedure: ESOPHAGOGASTRODUODENOSCOPY (EGD) WITH  PROPOFOL;  Surgeon: Manya Silvas, MD;  Location: [facility];  Service: Endoscopy;  Laterality: N/A;    Family History  Problem Relation Age of Onset  . Colon cancer Paternal Grandfather 82  . Colon cancer Paternal Uncle 33  . Heart disease Father   . Heart disease Mother   . Diabetes Mother   . Diabetes Daughter     Social History Social History  Substance Use Topics  . Smoking status: Former Smoker    Quit date: 11/06/1966  . Smokeless tobacco: Never Used  . Alcohol Use: 3.0 oz/week    0 Standard drinks or equivalent, 5 Glasses of wine per week     Comment: wine at night    No Known Allergies  Current Outpatient Prescriptions  Medication Sig Dispense Refill  . aspirin 81 MG EC tablet Take 81 mg by mouth daily.      Marland Kitchen atorvastatin (LIPITOR) 20 MG tablet Take 20 mg by mouth at bedtime.     . Calcium Carbonate-Vitamin D (CALCIUM-VITAMIN D3) 600-200 MG-UNIT TABS Take 1 tablet by mouth 2 (two) times daily.      . Calcium-Vitamin D 600-200 MG-UNIT per tablet Take by mouth.    . clobetasol ointment (TEMOVATE) 3.50 % Apply 1 application topically 2 (two) times a week. 30 g 2  . estradiol (ESTRACE)  0.1 MG/GM vaginal cream Place 1 Applicatorful vaginally 2 (two) times a week. 42.5 g 3  . letrozole ([place]) 2.5 MG tablet TAKE 1 TABLET (2.5 MG TOTAL) BY MOUTH DAILY. 30 tablet 0  . Multiple Vitamins-Minerals (CENTRUM) tablet Take 1 tablet by mouth daily.      . nebivolol (BYSTOLIC) 10 MG tablet TAKE 1 TABLET ONCE DAILY   PLEASE MAKE AN APPOINTMENT WITH YOUR DOCTOR    . pantoprazole (PROTONIX) 40 MG tablet Take 40 mg by mouth 2 (two) times daily.      Marland Kitchen PATADAY 0.2 % SOLN      No current facility-administered medications for this visit.    Review of Systems Review of Systems  Constitutional: Negative.   Respiratory: Negative.   Cardiovascular: Negative.     Blood pressure 130/74, pulse 76, resp. rate 12, height 5\' 3"  (1.6 m), weight 168 lb (76.204 kg).  Physical  Exam Physical Exam  Constitutional: She is oriented to person, place, and time. She appears well-developed and well-nourished.  Eyes: Conjunctivae are normal. No scleral icterus.  Neck: Neck supple.  Cardiovascular: Normal rate, regular rhythm and normal heart sounds.   Pulmonary/Chest: Effort normal and breath sounds normal. Left breast exhibits no inverted nipple, no mass, no nipple discharge, no skin change and no tenderness.  Chronic retraction of left nipple, unchanged from previous exams. Right mastectomy site well healed.No sign of local recurrence  Abdominal: Soft. Bowel sounds are normal. There is no hepatomegaly. There is no tenderness.  Lymphadenopathy:    She has no cervical adenopathy.    She has no axillary adenopathy.  Neurological: She is alert and oriented to person, place, and time.  Skin: Skin is warm and dry.    Data Reviewed Progress notes reviewed.  Assessment    Stable exam, 5 mos post right breast CA treatment-mastectomy with SN biopsy. T1c,N0,M0, ER/PR pos, Her 2 neg. Mammoprint was low score. Doing well on letrozole.     Plan    Continue letrozole, diagnostic mammogram left breast in 4 months. F/u with Dr. Oliva Bustard as scheduled.     PCP: Fulton Reek, MD           Forest Gleason, MD  [facility] 09/13/2015, 9:18 AM

## 2015-09-21 ENCOUNTER — Ambulatory Visit: Payer: Medicare Other | Admitting: Obstetrics and Gynecology

## 2015-10-05 ENCOUNTER — Ambulatory Visit (INDEPENDENT_AMBULATORY_CARE_PROVIDER_SITE_OTHER): Payer: Medicare Other | Admitting: Obstetrics and Gynecology

## 2015-10-05 ENCOUNTER — Encounter: Payer: Self-pay | Admitting: Obstetrics and Gynecology

## 2015-10-05 VITALS — BP 137/70 | HR 66 | Ht 64.0 in | Wt 165.0 lb

## 2015-10-05 DIAGNOSIS — C50911 Malignant neoplasm of unspecified site of right female breast: Secondary | ICD-10-CM

## 2015-10-05 DIAGNOSIS — Z0000 Encounter for general adult medical examination without abnormal findings: Secondary | ICD-10-CM | POA: Diagnosis not present

## 2015-10-05 DIAGNOSIS — N952 Postmenopausal atrophic vaginitis: Secondary | ICD-10-CM

## 2015-10-05 DIAGNOSIS — Z1211 Encounter for screening for malignant neoplasm of colon: Secondary | ICD-10-CM | POA: Diagnosis not present

## 2015-10-05 DIAGNOSIS — L439 Lichen planus, unspecified: Secondary | ICD-10-CM

## 2015-10-05 DIAGNOSIS — Z01419 Encounter for gynecological examination (general) (routine) without abnormal findings: Secondary | ICD-10-CM

## 2015-10-05 DIAGNOSIS — Z124 Encounter for screening for malignant neoplasm of cervix: Secondary | ICD-10-CM | POA: Diagnosis not present

## 2015-10-05 MED ORDER — CLOBETASOL PROPIONATE 0.05 % EX OINT: 1.0000 "application " | TOPICAL_OINTMENT | CUTANEOUS | Status: DC

## 2015-10-05 NOTE — Patient Instructions (Signed)
1.  No Pap smear. 2.  Mammogram in March 2017 per Dr. Jamal Collin 3.  Stool guaiac cards are given. 4.  Continue Temovate ointment twice weekly to the vulva for lichen planus. 5.  Continue calcium with vitamin D supplementation. 6.  Return in 6 months for follow-up on lichen planus. 7.  Return in 1 year for annual physical. 8.  Notify us for any significant abdominal pelvic symptom changes.  The patient does understand that her pelvic exam is limited due to severe vaginal atrophy/stenosis and follow-up ultrasound would be recommended.  If she has any pelvic symptomatology.

## 2015-10-05 NOTE — Progress Notes (Signed)
Patient ID: [name], female   DOB: 1933-08-09, 81 y.o.   MRN: [id number] ANNUAL PREVENTATIVE CARE GYN  ENCOUNTER NOTE  Subjective:       [name] is a 81 y.o. [phone] female here for a routine annual gynecologic exam. Additional current complaints: 1.  Cold - sinus ; Multiple family members with URI type symptoms  2.  History of lichen planus/lichen sclerosus;f/o on LS- refill temovate; Minimal symptomatology without vaginal discharge or itching 3.  Vaginal atrophy, unable to use estrogen due to breast cancer 4..  History of breast cancer status post right radical modified mastectomy ER positive/PR positive   Gynecologic History No LMP recorded. Patient is postmenopausal. Contraception: post menopausal status Last Pap: 06/2012 neg- no further paps needed. Results were: normal Last mammogram: 01/2015. Results were: abnormal- surgeon ordered mammo for 01/2016 - rt breast ca- s/p Modified right radical mastectomy; Currently on a Letrozole  Obstetric History OB History  Gravida Para Term Preterm AB SAB TAB Ectopic Multiple Living  4 3 3  0 1 1 0 0 0 3    # Outcome Date GA Lbr Len/2nd Weight Sex Delivery Anes PTL Lv  4 Term      Vag-Spont   Y  3 Term      Vag-Spont   Y  2 Term      Vag-Spont   Y  1 SAB         FD    Obstetric Comments  1st Menstrual Cycle:  17  1st Pregnancy:  25    Past Medical History  Diagnosis Date  . Hypertension     Unspecified  . Hyperlipidemia     Mixed  . Seasonal allergies   . GERD (gastroesophageal reflux disease)   . Elevated lipase   . Vasomotor instability   . Reactive airway disease   . Cardiac dysrhythmia   . Peptic ulcer   . Inflammatory arthritis ([place])   . Osteoarthritis     hands, cervical spine and trigger nodules  . Lichen planus     oral and vulvar  . Vaginal atrophy   . Valvular disease   . Chronic cough   . Cancer ([place]) 02/16/15    Right breast, T1c, NO, MO; ER/PR pos. Her 2 neg    Past Surgical History  Procedure  Laterality Date  . Breast lumpectomy Left 2012  . Breast biopsy Right 02/16/15  . Carpal tunnel release Right   . Simple mastectomy with axillary sentinel node biopsy Right 03/19/2015    Procedure: right total MASTECTOMY with sentinel node biopsy ;  Surgeon: Christene Lye, MD;  Location: [facility];  Service: General;  Laterality: Right;  . Sentinel node biopsy Right 03/19/2015    Procedure: SENTINEL NODE BIOPSY;  Surgeon: Christene Lye, MD;  Location: [facility];  Service: General;  Laterality: Right;  . Axillary lymph node biopsy Right 03/19/2015    Procedure: AXILLARY LYMPH NODE BIOPSY;  Surgeon: Christene Lye, MD;  Location: [facility];  Service: General;  Laterality: Right;  . Vaginoplasty    . Colon polyp remo    . Esophagogastroduodenoscopy (egd) with propofol N/A 07/28/2015    Procedure: ESOPHAGOGASTRODUODENOSCOPY (EGD) WITH PROPOFOL;  Surgeon: Manya Silvas, MD;  Location: [place];  Service: Endoscopy;  Laterality: N/A;    Current Outpatient Prescriptions on File Prior to Visit  Medication Sig Dispense Refill  . aspirin 81 MG EC tablet Take 81 mg by mouth daily.      Marland Kitchen  atorvastatin (LIPITOR) 20 MG tablet Take 20 mg by mouth at bedtime.     . Calcium Carbonate-Vitamin D (CALCIUM-VITAMIN D3) 600-200 MG-UNIT TABS Take 1 tablet by mouth 2 (two) times daily.      . clobetasol ointment (TEMOVATE) [id number] % Apply 1 application topically 2 (two) times a week. 30 g 2  . Multiple Vitamins-Minerals (CENTRUM) tablet Take 1 tablet by mouth daily.      . nebivolol (BYSTOLIC) 10 MG tablet TAKE 1 TABLET ONCE DAILY   PLEASE MAKE AN APPOINTMENT WITH YOUR DOCTOR    . pantoprazole (PROTONIX) 40 MG tablet Take 40 mg by mouth 2 (two) times daily.      Marland Kitchen PATADAY 0.2 % SOLN      No current facility-administered medications on file prior to visit.    No Known Allergies  Social History   Social History  . Marital Status: Married    Spouse Name: N/A  . Number of Children: N/A   . Years of Education: N/A   Occupational History  . Retired    Social History Main Topics  . Smoking status: Former Smoker    Quit date: 11/06/1966  . Smokeless tobacco: Never Used  . Alcohol Use: 3.0 oz/week    5 Glasses of wine, 0 Standard drinks or equivalent per week     Comment: wine at night  . Drug Use: No  . Sexual Activity: No   Other Topics Concern  . Not on file   Social History Narrative   Pt gets regular exercise.   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    She is the wife of Dr. Lorin Picket Poch's twin brother.    Family History  Problem Relation Age of Onset  . Colon cancer Paternal Grandfather 52  . Colon cancer Paternal Uncle 57  . Heart disease Father   . Heart disease Mother   . Diabetes Mother   . Diabetes Daughter     The following portions of the patient's history were reviewed and updated as appropriate: allergies, current medications, past family history, past medical history, past social history, past surgical history and problem list.  Review of Systems ROS Review of Systems - General ROS: negative for - chills, fatigue, fever, , night sweats, weight gain or weight loss.  POSITIVE-hot flashes Psychological ROS: negative for - anxiety, decreased libido, depression, mood swings, physical abuse or sexual abuse Ophthalmic ROS: negative for - blurry vision, eye pain or loss of vision ENT ROS: negative for - headaches, hearing change, visual changes or vocal changes Allergy and Immunology ROS: negative for - hives, itchy/watery eyes or seasonal allergies Hematological and Lymphatic ROS: negative for - bleeding problems, bruising, swollen lymph nodes or weight loss Endocrine ROS: negative for - galactorrhea, hair pattern changes, hot flashes, malaise/lethargy, mood swings, palpitations, polydipsia/polyuria, skin changes, temperature intolerance or unexpected weight changes Breast ROS: negative for - new or changing breast lumps or nipple  discharge Respiratory ROS: negative for - cough or shortness of breath Cardiovascular ROS: negative for - chest pain, irregular heartbeat, palpitations or shortness of breath Gastrointestinal ROS: no abdominal pain, change in bowel habits, or black or bloody stools Genito-Urinary ROS: no dysuria, trouble voiding, or hematuria Musculoskeletal ROS: negative for - joint pain or joint stiffness Neurological ROS: negative for - bowel and bladder control changes Dermatological ROS: negative for rash and skin lesion changes   Objective:   BP 137/70 mmHg  Pulse 66  Ht 5\' 4"  (1.626 m)  Wt  165 lb (74.844 kg)  BMI 28.31 kg/m2 CONSTITUTIONAL: Well-developed, well-nourished female in no acute distress.  PSYCHIATRIC: Normal mood and affect. Normal behavior. Normal judgment and thought content. [place]: Alert and oriented to person, place, and time. Normal muscle tone coordination. No cranial nerve deficit noted. HENT:  Normocephalic, atraumatic, External right and left ear normal. Oropharynx is clear and moist EYES: Conjunctivae and EOM are normal. Pupils are equal, round, and reactive to light. No scleral icterus.  NECK: Normal range of motion, supple, no masses.  Normal thyroid.  SKIN: Skin is warm and dry. No rash noted. Not diaphoretic. No erythema. No pallor. CARDIOVASCULAR: Normal heart rate noted, regular rhythm, no murmur. RESPIRATORY: Clear to auscultation bilaterally. Effort and breath sounds normal, no problems with respiration noted. BREASTS: Right mastectomy scar healed; Left breast with 6 cm scar, upper inner quadrant, healed, left nipple inverted (chronic); no lymphadenopathy, skin changes, tenderness ABDOMEN: Soft, normal bowel sounds, Protuberant; no organomegaly;  No tenderness, rebound or guarding.  BLADDER: Normal PELVIC:  External Genitalia: Atrophic changes with agglutination of the labia minora to the labia majora.  No leukoplakia, no vaginal discharge  BUS: Normal; Urethral  caruncle present  Vagina: Atrophic; stenotic, admitting a pediatric speculum, (limited); no discharge; admits first digit of exam finger only on exam without palpable masses  Cervix: Not visible  Uterus: Nonpalpable  Adnexa: Normal; No masses or tenderness  RV: External Exam NormaI, No Rectal Masses and Normal Sphincter tone  MUSCULOSKELETAL: Normal range of motion. No tenderness.  No cyanosis, clubbing, or edema.  2+ distal pulses. LYMPHATIC: No Axillary, Supraclavicular, or Inguinal Adenopathy.    Assessment:   Annual gynecologic examination 81 y.o. Contraception: post menopausal status Overweight Problem List Items Addressed This Visit    None      Plan:  Pap: Not needed Mammogram: thru pcp Stool Guaiac Testing:  Ordered Labs: thru pcp Routine preventative health maintenance measures emphasized: Exercise/Diet/Weight control, Tobacco Warnings and Alcohol/Substance use risks Continue Temovate ointment twice weekly. Continue calcium with vitamin D supplementation twice a day Return to [place] for  Gynecologic physical 6 months-follow-up on lichen planus  Joyice Faster, CMA  Brayton Mars, MD  Note: This dictation was prepared with Dragon dictation along with smaller phrase technology. Any transcriptional errors that result from this process are unintentional.

## 2015-11-02 ENCOUNTER — Inpatient Hospital Stay: Payer: Medicare Other

## 2015-11-02 ENCOUNTER — Inpatient Hospital Stay: Payer: Medicare Other | Admitting: Oncology

## 2015-11-11 ENCOUNTER — Other Ambulatory Visit: Payer: Self-pay | Admitting: *Deleted

## 2015-11-11 DIAGNOSIS — D0511 Intraductal carcinoma in situ of right breast: Secondary | ICD-10-CM

## 2015-11-23 ENCOUNTER — Inpatient Hospital Stay: Payer: Medicare Other | Admitting: Oncology

## 2015-11-23 ENCOUNTER — Inpatient Hospital Stay: Payer: Medicare Other

## 2015-11-24 ENCOUNTER — Other Ambulatory Visit: Payer: Self-pay | Admitting: *Deleted

## 2015-11-24 DIAGNOSIS — D0511 Intraductal carcinoma in situ of right breast: Secondary | ICD-10-CM

## 2015-12-06 ENCOUNTER — Encounter: Payer: Self-pay | Admitting: *Deleted

## 2015-12-14 ENCOUNTER — Inpatient Hospital Stay: Payer: Medicare Other | Attending: Oncology

## 2015-12-14 ENCOUNTER — Inpatient Hospital Stay (HOSPITAL_BASED_OUTPATIENT_CLINIC_OR_DEPARTMENT_OTHER): Payer: Medicare Other | Admitting: Oncology

## 2015-12-14 VITALS — BP 139/83 | HR 80 | Temp 98.4°F | Resp 18 | Wt 172.2 lb

## 2015-12-14 DIAGNOSIS — Z79899 Other long term (current) drug therapy: Secondary | ICD-10-CM | POA: Insufficient documentation

## 2015-12-14 DIAGNOSIS — Z170 Estrogen receptor positive status [ER+]: Secondary | ICD-10-CM

## 2015-12-14 DIAGNOSIS — Z87891 Personal history of nicotine dependence: Secondary | ICD-10-CM | POA: Insufficient documentation

## 2015-12-14 DIAGNOSIS — E785 Hyperlipidemia, unspecified: Secondary | ICD-10-CM | POA: Diagnosis not present

## 2015-12-14 DIAGNOSIS — Z79811 Long term (current) use of aromatase inhibitors: Secondary | ICD-10-CM | POA: Insufficient documentation

## 2015-12-14 DIAGNOSIS — D0591 Unspecified type of carcinoma in situ of right breast: Secondary | ICD-10-CM | POA: Diagnosis not present

## 2015-12-14 DIAGNOSIS — I10 Essential (primary) hypertension: Secondary | ICD-10-CM | POA: Insufficient documentation

## 2015-12-14 DIAGNOSIS — Z7982 Long term (current) use of aspirin: Secondary | ICD-10-CM | POA: Diagnosis not present

## 2015-12-14 DIAGNOSIS — C50911 Malignant neoplasm of unspecified site of right female breast: Secondary | ICD-10-CM

## 2015-12-14 DIAGNOSIS — C50211 Malignant neoplasm of upper-inner quadrant of right female breast: Secondary | ICD-10-CM | POA: Diagnosis not present

## 2015-12-14 DIAGNOSIS — Z9011 Acquired absence of right breast and nipple: Secondary | ICD-10-CM | POA: Insufficient documentation

## 2015-12-14 DIAGNOSIS — K219 Gastro-esophageal reflux disease without esophagitis: Secondary | ICD-10-CM | POA: Insufficient documentation

## 2015-12-14 DIAGNOSIS — M1990 Unspecified osteoarthritis, unspecified site: Secondary | ICD-10-CM | POA: Diagnosis not present

## 2015-12-14 DIAGNOSIS — Z800 Family history of malignant neoplasm of digestive organs: Secondary | ICD-10-CM | POA: Diagnosis not present

## 2015-12-14 LAB — CBC WITH DIFFERENTIAL/PLATELET
Basophils Absolute: 0.1 10*3/uL (ref 0–0.1)
Basophils Relative: 1 %
EOS PCT: 2 %
Eosinophils Absolute: 0.2 10*3/uL (ref 0–0.7)
HCT: 42.5 % (ref 35.0–47.0)
Hemoglobin: 14.6 g/dL (ref 12.0–16.0)
LYMPHS ABS: 1.6 10*3/uL (ref 1.0–3.6)
LYMPHS PCT: 23 %
MCH: 31.1 pg (ref 26.0–34.0)
MCHC: 34.4 g/dL (ref 32.0–36.0)
MCV: 90.4 fL (ref 80.0–100.0)
MONO ABS: 0.8 10*3/uL (ref 0.2–0.9)
MONOS PCT: 11 %
Neutro Abs: 4.4 10*3/uL (ref 1.4–6.5)
Neutrophils Relative %: 63 %
PLATELETS: 234 10*3/uL (ref 150–440)
RBC: 4.7 MIL/uL (ref 3.80–5.20)
RDW: 14.5 % (ref 11.5–14.5)
WBC: 6.9 10*3/uL (ref 3.6–11.0)

## 2015-12-14 LAB — COMPREHENSIVE METABOLIC PANEL
ALT: 29 U/L (ref 14–54)
AST: 28 U/L (ref 15–41)
Albumin: 4.3 g/dL (ref 3.5–5.0)
Alkaline Phosphatase: 113 U/L (ref 38–126)
Anion gap: 8 (ref 5–15)
BUN: 25 mg/dL — ABNORMAL HIGH (ref 6–20)
CHLORIDE: 108 mmol/L (ref 101–111)
CO2: 22 mmol/L (ref 22–32)
CREATININE: 0.84 mg/dL (ref 0.44–1.00)
Calcium: 9.3 mg/dL (ref 8.9–10.3)
Glucose, Bld: 137 mg/dL — ABNORMAL HIGH (ref 65–99)
Potassium: 3.7 mmol/L (ref 3.5–5.1)
Sodium: 138 mmol/L (ref 135–145)
Total Bilirubin: 1 mg/dL (ref 0.3–1.2)
Total Protein: 7 g/dL (ref 6.5–8.1)

## 2015-12-19 ENCOUNTER — Encounter: Payer: Self-pay | Admitting: Oncology

## 2015-12-19 NOTE — Progress Notes (Signed)
[place] @ [facility] [phone]  Fax:([phone]) [place] OB: [birth year]  MR#: [id number]  EXH#:[id number]  Patient Care Team: Idelle Crouch, MD as PCP - General (Internal Medicine) Idelle Crouch, MD (Internal Medicine) Seeplaputhur Robinette Haines, MD (General Surgery)  CHIEF COMPLAINT:  Chief Complaint  Patient presents with  . Breast Cancer    Oncology History   v1.  Carcinoma of right breast 2 different reasons. 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   3.  Status post right breast mastectomy With tumor size of T1 cN0 M0.  Estrogen receptor positive.  Progesterone receptor positive.  Sentinel lymph nodes were negative.  (May, 2016) mammo print  Low risk  Patient has been started on letrozole from April 29, 2015     Cancer of right breast ([place])   04/03/2015 Initial Diagnosis Cancer of right breast    Oncology Flowsheet 03/19/2015  dexamethasone (DECADRON) IJ -  ondansetron (ZOFRAN) IV -  ondansetron (ZOFRAN-ODT) PO -    INTERVAL HISTORY:  82 year old lady with a history of carcinoma of breast status post right breast mastectomy.  Patient is here for ongoing evaluation and treatment consideration.  Mastectomy wound is healing well. Patient and family and number of questions regarding further therapy and planning. April 29, 2015 Patient is here for ongoing evaluation after mastectomy patient has been evaluated for possibility of adjuvant treatment Oncotype DX score was very low. Because patient had mastectomy done does not need any radiation therapy Patient is tolerating letrozole very well.  Taking calcium and vitamin D. Getting bone density done at primary care office.  No bony pain no bony  fracture. Mammogram was arranged by Dr. Jamal Collin  REVIEW OF SYSTEMS:   GENERAL:  Feels good.  Active.  No fevers, sweats or weight loss. PERFORMANCE STATUS (ECOG):0 HEENT:  No visual changes, runny nose, sore throat, mouth sores or tenderness. Lungs: No shortness of breath or cough.  No hemoptysis. Cardiac:  No chest pain, palpitations, orthopnea, or PND. GI:  No nausea, vomiting, diarrhea, constipation, melena or hematochezia. GU:  No urgency, frequency, dysuria, or hematuria. Musculoskeletal:  No back pain.  No joint pain.  No muscle tenderness. Extremities:  No pain or swelling. Skin:  No rashes or skin changes. Neuro:  No headache, numbness or weakness, balance or coordination issues. Endocrine:  No diabetes, thyroid issues, hot flashes or night sweats. Psych:  No mood changes, depression or anxiety. Pain:  No focal pain. Review of systems:  All other systems reviewed and found to be negative. Patient has been off all estrogen supplements  As per HPI. Otherwise, a complete review of systems is negatve.  PAST MEDICAL HISTORY: Past Medical History  Diagnosis Date  . Hypertension     Unspecified  . Hyperlipidemia     Mixed  . Seasonal allergies   . GERD (gastroesophageal reflux disease)   . Elevated lipase   . Vasomotor instability   . Reactive airway disease   . Cardiac dysrhythmia   . Peptic ulcer   . Inflammatory arthritis ([place])   . Osteoarthritis     hands, cervical spine and trigger nodules  . Lichen planus     oral and vulvar  . Vaginal atrophy   . Valvular disease   . Chronic cough   .  Cancer ([place]) 02/16/15    Right breast, T1c, NO, MO; ER/PR pos. Her 2 neg    PAST SURGICAL HISTORY: Past Surgical History  Procedure Laterality Date  . Breast lumpectomy Left 2012  . Breast biopsy Right 02/16/15  . Carpal tunnel release Right   . Simple mastectomy with axillary sentinel node biopsy Right 03/19/2015    Procedure: right total MASTECTOMY with sentinel node biopsy  ;  Surgeon: Christene Lye, MD;  Location: [facility];  Service: General;  Laterality: Right;  . Sentinel node biopsy Right 03/19/2015    Procedure: SENTINEL NODE BIOPSY;  Surgeon: Christene Lye, MD;  Location: [facility];  Service: General;  Laterality: Right;  . Axillary lymph node biopsy Right 03/19/2015    Procedure: AXILLARY LYMPH NODE BIOPSY;  Surgeon: Christene Lye, MD;  Location: [facility];  Service: General;  Laterality: Right;  . Vaginoplasty    . Colon polyp remo    . Esophagogastroduodenoscopy (egd) with propofol N/A 07/28/2015    Procedure: ESOPHAGOGASTRODUODENOSCOPY (EGD) WITH PROPOFOL;  Surgeon: Manya Silvas, MD;  Location: [place];  Service: Endoscopy;  Laterality: N/A;    FAMILY HISTORY Family History  Problem Relation Age of Onset  . Colon cancer Paternal Grandfather 3  . Colon cancer Paternal Uncle 38  . Heart disease Father   . Heart disease Mother   . Diabetes Mother   . Diabetes Daughter     GYNECOLOGIC HISTORY:  No LMP recorded. Patient is postmenopausal.     ADVANCED DIRECTIVES: Patient does have living will   HEALTH MAINTENANCE: Social History  Substance Use Topics  . Smoking status: Former Smoker    Quit date: 11/06/1966  . Smokeless tobacco: Never Used  . Alcohol Use: 3.0 oz/week    5 Glasses of wine, 0 Standard drinks or equivalent per week     Comment: wine at night      No Known Allergies  Current Outpatient Prescriptions  Medication Sig Dispense Refill  . aspirin 81 MG EC tablet Take 81 mg by mouth daily.      Marland Kitchen atorvastatin (LIPITOR) 20 MG tablet Take 20 mg by mouth at bedtime.     . Calcium Carbonate-Vitamin D (CALCIUM-VITAMIN D3) 600-200 MG-UNIT TABS Take 1 tablet by mouth 2 (two) times daily.      . clobetasol ointment (TEMOVATE) 5.46 % Apply 1 application topically 2 (two) times a week. 30 g 2  . letrozole (FEMARA) 2.5 MG tablet Take 2.5 mg by mouth daily.    . Multiple Vitamins-Minerals (CENTRUM) tablet  Take 1 tablet by mouth daily.      . nebivolol (BYSTOLIC) 10 MG tablet TAKE 1 TABLET ONCE DAILY   PLEASE MAKE AN APPOINTMENT WITH YOUR DOCTOR    . pantoprazole (PROTONIX) 40 MG tablet Take 40 mg by mouth 2 (two) times daily.      Marland Kitchen PATADAY 0.2 % SOLN      No current facility-administered medications for this visit.    OBJECTIVE:  Filed Vitals:   12/14/15 1456  BP: 139/83  Pulse: 80  Temp: 98.4 F (36.9 C)  Resp: 18     Body mass index is 29.54 kg/(m^2).    ECOG FS:0 - Asymptomatic  PHYSICAL EXAM: GENERAL:  Well developed, well nourished, sitting comfortably in the exam room in no acute distress. MENTAL STATUS:  Alert and oriented to person, place and time. HEAD  Normocephalic, atraumatic, face symmetric, no Cushingoid features. EYES:  .  Pupils equal round and reactive to  light and accomodation.  No conjunctivitis or scleral icterus. ENT:  Oropharynx clear without lesion.  Tongue normal. Mucous membranes moist.  RESPIRATORY:  Clear to auscultation without rales, wheezes or rhonchi. CARDIOVASCULAR:  Regular rate and rhythm without murmur, rub or gallop. BREAST:  Right breast : Status post mastectomy.  Wound is healing well.  Patient still has a drainage tube.  Left breast free of masses ABDOMEN:  Soft, non-tender, with active bowel sounds, and no hepatosplenomegaly.  No masses. BACK:  No CVA tenderness.  No tenderness on percussion of the back or rib cage. SKIN:  No rashes, ulcers or lesions. EXTREMITIES: No edema, no skin discoloration or tenderness.  No palpable cords. LYMPH NODES: No palpable cervical, supraclavicular, axillary or inguinal adenopathy  NEUROLOGICAL: Unremarkable. PSYCH:  Appropriate.   LAB RESULTS:  Appointment on 12/14/2015  Component Date Value Ref Range Status  . WBC 12/14/2015 6.9  3.6 - 11.0 K/uL Final  . RBC 12/14/2015 4.70  3.80 - 5.20 MIL/uL Final  . Hemoglobin 12/14/2015 14.6  12.0 - 16.0 g/dL Final  . HCT 12/14/2015 42.5  35.0 - 47.0 % Final  .  MCV 12/14/2015 90.4  80.0 - 100.0 fL Final  . MCH 12/14/2015 31.1  26.0 - 34.0 pg Final  . MCHC 12/14/2015 34.4  32.0 - 36.0 g/dL Final  . RDW 12/14/2015 14.5  11.5 - 14.5 % Final  . Platelets 12/14/2015 234  150 - 440 K/uL Final  . Neutrophils Relative % 12/14/2015 63   Final  . Neutro Abs 12/14/2015 4.4  1.4 - 6.5 K/uL Final  . Lymphocytes Relative 12/14/2015 23   Final  . Lymphs Abs 12/14/2015 1.6  1.0 - 3.6 K/uL Final  . Monocytes Relative 12/14/2015 11   Final  . Monocytes Absolute 12/14/2015 0.8  0.2 - 0.9 K/uL Final  . Eosinophils Relative 12/14/2015 2   Final  . Eosinophils Absolute 12/14/2015 0.2  0 - 0.7 K/uL Final  . Basophils Relative 12/14/2015 1   Final  . Basophils Absolute 12/14/2015 0.1  0 - 0.1 K/uL Final  . Sodium 12/14/2015 138  135 - 145 mmol/L Final  . Potassium 12/14/2015 3.7  3.5 - 5.1 mmol/L Final  . Chloride 12/14/2015 108  101 - 111 mmol/L Final  . CO2 12/14/2015 22  22 - 32 mmol/L Final  . Glucose, Bld 12/14/2015 137* 65 - 99 mg/dL Final  . BUN 12/14/2015 25* 6 - 20 mg/dL Final  . Creatinine, Ser 12/14/2015 0.84  0.44 - 1.00 mg/dL Final  . Calcium 12/14/2015 9.3  8.9 - 10.3 mg/dL Final  . Total Protein 12/14/2015 7.0  6.5 - 8.1 g/dL Final  . Albumin 12/14/2015 4.3  3.5 - 5.0 g/dL Final  . AST 12/14/2015 28  15 - 41 U/L Final  . ALT 12/14/2015 29  14 - 54 U/L Final  . Alkaline Phosphatase 12/14/2015 113  38 - 126 U/L Final  . Total Bilirubin 12/14/2015 1.0  0.3 - 1.2 mg/dL Final  . GFR calc non Af Amer 12/14/2015 >60  >60 mL/min Final  . GFR calc Af Amer 12/14/2015 >60  >60 mL/min Final   Comment: (NOTE) The eGFR has been calculated using the CKD EPI equation. This calculation has not been validated in all clinical situations. eGFR's persistently <60 mL/min signify possible Chronic Kidney Disease.   . Anion gap 12/14/2015 8  5 - 15 Final      STUDIES: No results found.  ASSESSMENT: Carcinoma of right breast.  Status post  mastectomy and sentinel  lymph node evaluation.  Tumor size 2 cm.  Estrogen receptor and progesterone receptor positive HER-2/neu receptor negative.  All sentinel lymph nodes were negative..  Mammogram pain was low risk  MEDICAL DECISION MAKING:  Carcinoma of breast stage II with 2 cm tumor estrogen receptor positive progesterone receptor positive Mammo print   Score is low Continue letrozole Bone density as per primary care physician Evaluation in 6 months or before if needed  Patient expressed understanding and was in agreement with this plan. She also understands that She can call clinic at any time with any questions, concerns, or complaints.    Cancer of right breast   Staging form: Breast, AJCC 7th Edition     Clinical: Stage IA (T1c, N0, M0) - Signed by Forest Gleason, MD on 04/03/2015   Forest Gleason, MD   12/19/2015 10:42 AM

## 2016-02-02 ENCOUNTER — Other Ambulatory Visit: Payer: Self-pay | Admitting: General Surgery

## 2016-02-02 ENCOUNTER — Ambulatory Visit
Admission: RE | Admit: 2016-02-02 | Discharge: 2016-02-02 | Disposition: A | Discharge status: Home / Self Care | Payer: Medicare Other | Source: Ambulatory Visit | Attending: General Surgery | Admitting: General Surgery

## 2016-02-02 DIAGNOSIS — Z1231 Encounter for screening mammogram for malignant neoplasm of breast: Secondary | ICD-10-CM

## 2016-02-02 DIAGNOSIS — D0511 Intraductal carcinoma in situ of right breast: Secondary | ICD-10-CM

## 2016-02-09 ENCOUNTER — Encounter: Payer: Self-pay | Admitting: General Surgery

## 2016-02-09 ENCOUNTER — Ambulatory Visit (INDEPENDENT_AMBULATORY_CARE_PROVIDER_SITE_OTHER): Payer: Medicare Other | Admitting: General Surgery

## 2016-02-09 VITALS — BP 132/72 | HR 70 | Resp 12 | Ht 64.0 in | Wt 169.0 lb

## 2016-02-09 DIAGNOSIS — C50211 Malignant neoplasm of upper-inner quadrant of right female breast: Secondary | ICD-10-CM | POA: Diagnosis not present

## 2016-02-09 NOTE — Patient Instructions (Addendum)
The patient is aware to call back for any questions or concerns. The patient has been asked to return to the office in one year with a left diagnostic mammogram.

## 2016-02-09 NOTE — Progress Notes (Signed)
Patient ID: [name], female   DOB: January 04, 1933, 82 y.o.   MRN: [id number]  Chief Complaint  Patient presents with  . Follow-up    mammogram    HPI [name] is a 82 y.o. female who presents for a breast cancer follow up. The most recent left breast mammogram was done on 02/02/16 .  Patient does perform regular self breast checks and gets regular mammograms done.  No new issues. I have reviewed the history of present illness with the patient. HPI  Past Medical History  Diagnosis Date  . Hypertension     Unspecified  . Hyperlipidemia     Mixed  . Seasonal allergies   . GERD (gastroesophageal reflux disease)   . Elevated lipase   . Vasomotor instability   . Reactive airway disease   . Cardiac dysrhythmia   . Peptic ulcer   . Inflammatory arthritis ([place])   . Osteoarthritis     hands, cervical spine and trigger nodules  . Lichen planus     oral and vulvar  . Vaginal atrophy   . Valvular disease   . Chronic cough   . Cancer ([place]) 02/16/15    Right breast, T1c, NO, MO; ER/PR pos. Her 2 neg    Past Surgical History  Procedure Laterality Date  . Breast lumpectomy Left 2012  . Carpal tunnel release Right   . Simple mastectomy with axillary sentinel node biopsy Right 03/19/2015    Procedure: right total MASTECTOMY with sentinel node biopsy ;  Surgeon: Christene Lye, MD;  Location: [facility];  Service: General;  Laterality: Right;  . Sentinel node biopsy Right 03/19/2015    Procedure: SENTINEL NODE BIOPSY;  Surgeon: Christene Lye, MD;  Location: [facility];  Service: General;  Laterality: Right;  . Axillary lymph node biopsy Right 03/19/2015    Procedure: AXILLARY LYMPH NODE BIOPSY;  Surgeon: Christene Lye, MD;  Location: [facility];  Service: General;  Laterality: Right;  . Vaginoplasty    . Colon polyp remo    . Esophagogastroduodenoscopy (egd) with propofol N/A 07/28/2015    Procedure: ESOPHAGOGASTRODUODENOSCOPY (EGD) WITH PROPOFOL;  Surgeon: Manya Silvas, MD;  Location: [place];  Service: Endoscopy;  Laterality: N/A;  . Mastectomy Right 03/19/2015    +  . Breast biopsy Right 02/16/15  . Breast excisional biopsy Left   . Breast biopsy Left     Family History  Problem Relation Age of Onset  . Colon cancer Paternal Grandfather 14  . Colon cancer Paternal Uncle 96  . Heart disease Father   . Heart disease Mother   . Diabetes Mother   . Diabetes Daughter     Social History Social History  Substance Use Topics  . Smoking status: Former Smoker    Quit date: 11/06/1966  . Smokeless tobacco: Never Used  . Alcohol Use: 3.0 oz/week    5 Glasses of wine, 0 Standard drinks or equivalent per week     Comment: wine at night    No Known Allergies  Current Outpatient Prescriptions  Medication Sig Dispense Refill  . aspirin 81 MG EC tablet Take 81 mg by mouth daily.      Marland Kitchen atorvastatin (LIPITOR) 20 MG tablet Take 20 mg by mouth at bedtime.     . Calcium Carbonate-Vitamin D (CALCIUM-VITAMIN D3) 600-200 MG-UNIT TABS Take 1 tablet by mouth 2 (two) times daily.      . clobetasol ointment (TEMOVATE) [id number] % Apply 1 application topically 2 (two)  times a week. 30 Hopkins 2  . letrozole (FEMARA) 2.5 MG tablet Take 2.5 mg by mouth daily.    . Multiple Vitamins-Minerals (CENTRUM) tablet Take 1 tablet by mouth daily.      . nebivolol (BYSTOLIC) 10 MG tablet TAKE 1 TABLET ONCE DAILY   PLEASE MAKE AN APPOINTMENT WITH YOUR DOCTOR    . pantoprazole (PROTONIX) 40 MG tablet Take 40 mg by mouth 2 (two) times daily.      Marland Kitchen PATADAY 0.2 % SOLN      No current facility-administered medications for this visit.    Review of Systems Review of Systems  Constitutional: Negative.   Respiratory: Negative.   Cardiovascular: Negative.     Blood pressure 132/72, pulse 70, resp. rate 12, height 5\' 4"  (1.626 m), weight 169 lb (76.658 kg).  Physical Exam Physical Exam  Constitutional: She is oriented to person, place, and time. She appears well-developed  and well-nourished.  HENT:  Mouth/Throat: Oropharynx is clear and moist.  Eyes: Conjunctivae are normal. No scleral icterus.  Neck: Neck supple.  Cardiovascular: Normal rate, regular rhythm and normal heart sounds.   Pulmonary/Chest: Left breast exhibits inverted nipple (chronic.). Left breast exhibits no mass, no nipple discharge, no skin change and no tenderness.  Right mastectomy site well healed.  Abdominal: Soft. Bowel sounds are normal. There is no hepatomegaly. There is no tenderness.  Lymphadenopathy:    She has no cervical adenopathy.    She has no axillary adenopathy.  Neurological: She is alert and oriented to person, place, and time.  Skin: Skin is warm and dry.  Psychiatric: Her behavior is normal.    Data Reviewed Mammogram reviewed.  Assessment    Stable exam, 11 months post right breast CA treatment-mastectomy with SN biopsy. T1c,N0,M0, ER/PR pos, Her 2 neg. Mammoprint was low score. Doing well on letrozole.  History of left breast cancer-s/p lumpectomy/radiation.    Plan    The patient has been asked to return to the office in one year with a left diagnostic mammogram.      PCP:  Doy Hutching This information has been scribed by Gaspar Cola CMA.    [name] 02/09/2016, 9:28 AM

## 2016-02-21 ENCOUNTER — Other Ambulatory Visit: Payer: Self-pay | Admitting: *Deleted

## 2016-02-21 MED ORDER — LETROZOLE 2.5 MG PO TABS: 2.5000 mg | ORAL_TABLET | Freq: Every day | ORAL | Status: DC

## 2016-03-08 ENCOUNTER — Emergency Department
Admission: EM | Admit: 2016-03-08 | Discharge: 2016-03-08 | Disposition: A | Discharge status: Home / Self Care | Payer: Medicare Other | Attending: Emergency Medicine | Admitting: Emergency Medicine

## 2016-03-08 ENCOUNTER — Encounter: Payer: Self-pay | Admitting: *Deleted

## 2016-03-08 ENCOUNTER — Emergency Department: Payer: Medicare Other

## 2016-03-08 DIAGNOSIS — E785 Hyperlipidemia, unspecified: Secondary | ICD-10-CM | POA: Diagnosis not present

## 2016-03-08 DIAGNOSIS — S42291A Other displaced fracture of upper end of right humerus, initial encounter for closed fracture: Secondary | ICD-10-CM | POA: Diagnosis not present

## 2016-03-08 DIAGNOSIS — Y9389 Activity, other specified: Secondary | ICD-10-CM | POA: Insufficient documentation

## 2016-03-08 DIAGNOSIS — I10 Essential (primary) hypertension: Secondary | ICD-10-CM | POA: Insufficient documentation

## 2016-03-08 DIAGNOSIS — M25552 Pain in left hip: Secondary | ICD-10-CM | POA: Diagnosis not present

## 2016-03-08 DIAGNOSIS — Y999 Unspecified external cause status: Secondary | ICD-10-CM | POA: Diagnosis not present

## 2016-03-08 DIAGNOSIS — Z853 Personal history of malignant neoplasm of breast: Secondary | ICD-10-CM | POA: Insufficient documentation

## 2016-03-08 DIAGNOSIS — Y929 Unspecified place or not applicable: Secondary | ICD-10-CM | POA: Insufficient documentation

## 2016-03-08 DIAGNOSIS — W010XXA Fall on same level from slipping, tripping and stumbling without subsequent striking against object, initial encounter: Secondary | ICD-10-CM | POA: Insufficient documentation

## 2016-03-08 DIAGNOSIS — S42351A Displaced comminuted fracture of shaft of humerus, right arm, initial encounter for closed fracture: Secondary | ICD-10-CM

## 2016-03-08 DIAGNOSIS — Z87891 Personal history of nicotine dependence: Secondary | ICD-10-CM | POA: Insufficient documentation

## 2016-03-08 DIAGNOSIS — Z79899 Other long term (current) drug therapy: Secondary | ICD-10-CM | POA: Insufficient documentation

## 2016-03-08 DIAGNOSIS — M25511 Pain in right shoulder: Secondary | ICD-10-CM | POA: Diagnosis present

## 2016-03-08 DIAGNOSIS — Z7982 Long term (current) use of aspirin: Secondary | ICD-10-CM | POA: Insufficient documentation

## 2016-03-08 MED ORDER — ACETAMINOPHEN-CODEINE #3 300-30 MG PO TABS
1.0000 | ORAL_TABLET | Freq: Once | ORAL | Status: AC
  Administered 2016-03-08: 1 via ORAL
  Filled 2016-03-08: qty 1

## 2016-03-08 MED ORDER — HYDROCODONE-ACETAMINOPHEN 5-325 MG PO TABS: 1.0000 | ORAL_TABLET | ORAL | Status: DC | PRN

## 2016-03-08 MED ORDER — ONDANSETRON HCL 4 MG PO TABS: 4.0000 mg | ORAL_TABLET | Freq: Every day | ORAL | Status: DC | PRN

## 2016-03-08 NOTE — ED Provider Notes (Signed)
[facility] Emergency Department Provider Note   Time seen: Approximately 11:55 AM  I have reviewed the triage vital signs and the nursing notes.   HISTORY  Chief Complaint Fall; Arm Pain; and Hip Pain   HPI [name] is a 82 y.o. female complaining of right shoulder and left hip pain following a fall this morning while gardening. Patients states she landed on her right shoulder but not on an outstretched hand. She denies any head or neck trauma and states she is able to walk and bear weight on both legs. She admits to pain, swelling and limited ROM in the right shoulder and arm as well as pain in the left hip. She denies HA, dizziness, numbness or tingling.    Past Medical History  Diagnosis Date  . Hypertension     Unspecified  . Hyperlipidemia     Mixed  . Seasonal allergies   . GERD (gastroesophageal reflux disease)   . Elevated lipase   . Vasomotor instability   . Reactive airway disease   . Cardiac dysrhythmia   . Peptic ulcer   . Inflammatory arthritis ([place])   . Osteoarthritis     hands, cervical spine and trigger nodules  . Lichen planus     oral and vulvar  . Vaginal atrophy   . Valvular disease   . Chronic cough   . Cancer ([place]) 02/16/15    Right breast, T1c, NO, MO; ER/PR pos. Her 2 neg    Patient Active Problem List   Diagnosis Date Noted  . Awareness of heartbeats 04/03/2015  . Gastroduodenal ulcer 04/03/2015  . Fast heart beat 04/03/2015  . Allergy to environmental factors 04/03/2015  . Cancer of right breast ([place]) 04/03/2015  . Chest pain 01/01/2015  . HYPERLIPIDEMIA-MIXED 04/14/2009  . HYPERTENSION, UNSPECIFIED 04/14/2009    Past Surgical History  Procedure Laterality Date  . Breast lumpectomy Left 2012  . Carpal tunnel release Right   . Simple mastectomy with axillary sentinel node biopsy Right 03/19/2015    Procedure: right total MASTECTOMY with sentinel node biopsy ;  Surgeon: Christene Lye, MD;   Location: [facility];  Service: General;  Laterality: Right;  . Sentinel node biopsy Right 03/19/2015    Procedure: SENTINEL NODE BIOPSY;  Surgeon: Christene Lye, MD;  Location: [facility];  Service: General;  Laterality: Right;  . Axillary lymph node biopsy Right 03/19/2015    Procedure: AXILLARY LYMPH NODE BIOPSY;  Surgeon: Christene Lye, MD;  Location: [facility];  Service: General;  Laterality: Right;  . Vaginoplasty    . Colon polyp remo    . Esophagogastroduodenoscopy (egd) with propofol N/A 07/28/2015    Procedure: ESOPHAGOGASTRODUODENOSCOPY (EGD) WITH PROPOFOL;  Surgeon: Manya Silvas, MD;  Location: [place];  Service: Endoscopy;  Laterality: N/A;  . Mastectomy Right 03/19/2015    +  . Breast biopsy Right 02/16/15  . Breast excisional biopsy Left   . Breast biopsy Left     Current Outpatient Rx  Name  Route  Sig  Dispense  Refill  . aspirin 81 MG EC tablet   Oral   Take 81 mg by mouth daily.           Marland Kitchen atorvastatin (LIPITOR) 20 MG tablet   Oral   Take 20 mg by mouth at bedtime.          . Calcium Carbonate-Vitamin D (CALCIUM-VITAMIN D3) 600-200 MG-UNIT TABS   Oral   Take 1 tablet by mouth  2 (two) times daily.           . clobetasol ointment (TEMOVATE) 0.05 %   Topical   Apply 1 application topically 2 (two) times a week.   30 g   2   . HYDROcodone-acetaminophen (NORCO) 5-325 MG tablet   Oral   Take 1-2 tablets by mouth every 4 (four) hours as needed for moderate pain.   15 tablet   0   . letrozole (FEMARA) 2.5 MG tablet   Oral   Take 1 tablet (2.5 mg total) by mouth daily.   30 tablet   2   . Multiple Vitamins-Minerals (CENTRUM) tablet   Oral   Take 1 tablet by mouth daily.           . nebivolol (BYSTOLIC) 10 MG tablet      TAKE 1 TABLET ONCE DAILY   PLEASE MAKE AN APPOINTMENT WITH YOUR DOCTOR         . ondansetron (ZOFRAN) 4 MG tablet   Oral   Take 1 tablet (4 mg total) by mouth daily as needed for nausea or vomiting.   30  tablet   1   . pantoprazole (PROTONIX) 40 MG tablet   Oral   Take 40 mg by mouth 2 (two) times daily.           Marland Kitchen PATADAY 0.2 % SOLN                 Dispense as written.     Allergies Review of patient's allergies indicates no known allergies.  Family History  Problem Relation Age of Onset  . Colon cancer Paternal Grandfather 54  . Colon cancer Paternal Uncle 50  . Heart disease Father   . Heart disease Mother   . Diabetes Mother   . Diabetes Daughter     Social History Social History  Substance Use Topics  . Smoking status: Former Smoker    Quit date: 11/06/1966  . Smokeless tobacco: Never Used  . Alcohol Use: 3.0 oz/week    5 Glasses of wine, 0 Standard drinks or equivalent per week     Comment: wine at night    Review of Systems  Eyes: No visual changes. Cardiovascular: Denies chest pain. Respiratory: Denies shortness of breath. Musculoskeletal: Negative for pain in the back, left hip, left upper extremity, or right wrist/hand.  Skin: Negative for rash, abrasions or bruising Neurological: Negative for headaches, focal weakness or numbness. 10-point ROS otherwise negative.  ____________________________________________   PHYSICAL EXAM:  VITAL SIGNS: ED Triage Vitals  Enc Vitals Group     BP 03/08/16 1105 170/66 mmHg     Pulse Rate 03/08/16 1105 62     Resp 03/08/16 1105 18     Temp 03/08/16 1105 97.7 F (36.5 C)     Temp Source 03/08/16 1105 Oral     SpO2 03/08/16 1105 100 %     Weight 03/08/16 1105 160 lb (72.576 kg)     Height 03/08/16 1105 5\' 4"  (1.626 m)     Head Cir --      Peak Flow --      Pain Score 03/08/16 1107 3     Pain Loc --      Pain Edu? --      Excl. in [place]? --     Constitutional: Alert and oriented. Well appearing and in no acute distress though in obvious pain Eyes: Conjunctivae are normal.. EOMI. Head: Atraumatic. Neck: No stridor. No cervical spine tenderness  to palpation. Respiratory: Normal respiratory effort.    Musculoskeletal: No lower extremity tenderness nor edema.  No joint effusions. Limited ROM and pain on palpation of the right shoulder. Humeral head potentially displaced and palpable. Neurologic:  Normal speech and language. No gross focal neurologic deficits are appreciated. Patient ambulated in via wheelchair.  Skin:  Skin is warm, dry and intact. No rash noted. Psychiatric: Mood and affect are normal. Speech and behavior are normal.  ____________________________________________   LABS (all labs ordered are listed, but only abnormal results are displayed)  Labs Reviewed - No data to display  RADIOLOGY  FINDINGS: DG shoulder Right A comminuted, mildly displaced fracture of the right humeral neck is seen. No other fractures identified. No evidence of dislocation. Generalized osteopenia noted.  DG Hip Unilat with Pelvis: IMPRESSION: No acute findings. ____________________________________________   PROCEDURES  Procedure(s) performed: radiology, see procedure note(s).  Critical Care performed: No  ____________________________________________   INITIAL IMPRESSION / ASSESSMENT AND PLAN / ED COURSE  Pertinent labs & imaging results that were available during my care of the patient were reviewed by me and considered in my medical decision making (see chart for details).  82 yo female presenting with pain in the right shoulder and left hip. Shoulder Imaging revealed a comminuted, mildly displaced fracture of the right humeral neck. Orthopedic on call consulted and advised to follow-up with Ortho on Monday for treatment. Patient discharged home with referral, education on humeral neck fractures and Norco 5-325mg  q4hrs PRN.  ____________________________________________   FINAL CLINICAL IMPRESSION(S) / ED DIAGNOSES  Final diagnoses:  Closed comminuted right humeral fracture      NEW MEDICATIONS STARTED DURING THIS VISIT:  Discharge Medication List as of 03/08/2016 12:53 PM     START taking these medications   Details  HYDROcodone-acetaminophen (NORCO) 5-325 MG tablet Take 1-2 tablets by mouth every 4 (four) hours as needed for moderate pain., Starting 03/08/2016, Until Discontinued, Print         Note:  This document was prepared using Dragon voice recognition software and may include unintentional dictation errors.   Arlyss Repress, PA-C 03/08/16 [place], MD 03/08/16 [phone]

## 2016-03-08 NOTE — Discharge Instructions (Signed)
Humerus Fracture Treated With Immobilization °The humerus is the large bone in your upper arm. You have a broken (fractured) humerus. These fractures are easily diagnosed with X-rays. °TREATMENT  °Simple fractures which will heal without disability are treated with simple immobilization. Immobilization means you will wear a cast, splint, or sling. You have a fracture which will do well with immobilization. The fracture will heal well simply by being held in a good position until it is stable enough to begin range of motion exercises. Do not take part in activities which would further injure your arm.  °HOME CARE INSTRUCTIONS  °· Put ice on the injured area. °¨ Put ice in a plastic bag. °¨ Place a towel between your skin and the bag. °¨ Leave the ice on for 15-20 minutes, 03-04 times a day. °· If you have a cast: °¨ Do not scratch the skin under the cast using sharp or pointed objects. °¨ Check the skin around the cast every day. You may put lotion on any red or sore areas. °¨ Keep your cast dry and clean. °· If you have a splint: °¨ Wear the splint as directed. °¨ Keep your splint dry and clean. °¨ You may loosen the elastic around the splint if your fingers become numb, tingle, or turn cold or blue. °· If you have a sling: °¨ Wear the sling as directed. °· Do not put pressure on any part of your cast or splint until it is fully hardened. °· Your cast or splint can be protected during bathing with a plastic bag. Do not lower the cast or splint into water. °· Only take over-the-counter or prescription medicines for pain, discomfort, or fever as directed by your caregiver. °· Do range of motion exercises as instructed by your caregiver. °· Follow up as directed by your caregiver. This is very important in order to avoid permanent injury or disability and chronic pain. °SEEK IMMEDIATE MEDICAL CARE IF:  °· Your skin or nails in the injured arm turn blue or gray. °· Your arm feels cold or numb. °· You develop severe pain  in the injured arm. °· You are having problems with the medicines you were given. °MAKE SURE YOU:  °· Understand these instructions. °· Will watch your condition. °· Will get help right away if you are not doing well or get worse. °  °This information is not intended to replace advice given to you by your health care provider. Make sure you discuss any questions you have with your health care provider. °  °Document Released: 01/29/2001 Document Revised: 11/13/2014 Document Reviewed: 03/17/2015 °Elsevier Interactive Patient Education ©2016 [employer] ° °

## 2016-03-08 NOTE — ED Notes (Signed)
See triage note  States she slipped and fell  Having pain to right upper arm  Small abrasion noted to right foot and some pain to left hip area

## 2016-03-08 NOTE — ED Notes (Signed)
States she was watering her flowers on the steps outside and slipped, now has right arm pain, and left hip pain, no deformity noted, denies hitting her head

## 2016-03-15 ENCOUNTER — Encounter (HOSPITAL_COMMUNITY): Payer: Self-pay | Admitting: *Deleted

## 2016-03-16 ENCOUNTER — Encounter (HOSPITAL_COMMUNITY)
Admission: RE | Disposition: A | Discharge status: Home / Self Care | Payer: Self-pay | Source: Ambulatory Visit | Attending: Orthopedic Surgery

## 2016-03-16 ENCOUNTER — Ambulatory Visit (HOSPITAL_COMMUNITY): Payer: Medicare Other

## 2016-03-16 ENCOUNTER — Ambulatory Visit (HOSPITAL_COMMUNITY): Payer: Medicare Other | Admitting: Certified Registered Nurse Anesthetist

## 2016-03-16 ENCOUNTER — Observation Stay (HOSPITAL_COMMUNITY)
Admission: RE | Admit: 2016-03-16 | Discharge: 2016-03-17 | Disposition: A | Discharge status: Home / Self Care | Payer: Medicare Other | Source: Ambulatory Visit | Attending: Orthopedic Surgery | Admitting: Orthopedic Surgery

## 2016-03-16 ENCOUNTER — Encounter (HOSPITAL_COMMUNITY): Payer: Self-pay | Admitting: Surgery

## 2016-03-16 DIAGNOSIS — E782 Mixed hyperlipidemia: Secondary | ICD-10-CM | POA: Diagnosis not present

## 2016-03-16 DIAGNOSIS — Z853 Personal history of malignant neoplasm of breast: Secondary | ICD-10-CM | POA: Insufficient documentation

## 2016-03-16 DIAGNOSIS — Z87891 Personal history of nicotine dependence: Secondary | ICD-10-CM | POA: Diagnosis not present

## 2016-03-16 DIAGNOSIS — W19XXXA Unspecified fall, initial encounter: Secondary | ICD-10-CM | POA: Diagnosis not present

## 2016-03-16 DIAGNOSIS — Z9889 Other specified postprocedural states: Secondary | ICD-10-CM

## 2016-03-16 DIAGNOSIS — Z419 Encounter for procedure for purposes other than remedying health state, unspecified: Secondary | ICD-10-CM

## 2016-03-16 DIAGNOSIS — I10 Essential (primary) hypertension: Secondary | ICD-10-CM | POA: Insufficient documentation

## 2016-03-16 DIAGNOSIS — Z8781 Personal history of (healed) traumatic fracture: Secondary | ICD-10-CM

## 2016-03-16 DIAGNOSIS — Z7982 Long term (current) use of aspirin: Secondary | ICD-10-CM | POA: Insufficient documentation

## 2016-03-16 DIAGNOSIS — S42201A Unspecified fracture of upper end of right humerus, initial encounter for closed fracture: Principal | ICD-10-CM | POA: Insufficient documentation

## 2016-03-16 DIAGNOSIS — Z79899 Other long term (current) drug therapy: Secondary | ICD-10-CM | POA: Diagnosis not present

## 2016-03-16 DIAGNOSIS — Z9011 Acquired absence of right breast and nipple: Secondary | ICD-10-CM | POA: Insufficient documentation

## 2016-03-16 DIAGNOSIS — K219 Gastro-esophageal reflux disease without esophagitis: Secondary | ICD-10-CM | POA: Diagnosis not present

## 2016-03-16 DIAGNOSIS — M25411 Effusion, right shoulder: Secondary | ICD-10-CM | POA: Diagnosis not present

## 2016-03-16 HISTORY — DX: Cardiac murmur, unspecified: R01.1

## 2016-03-16 HISTORY — PX: ORIF PROXIMAL HUMERUS FRACTURE: SUR951

## 2016-03-16 HISTORY — DX: Cardiac arrhythmia, unspecified: I49.9

## 2016-03-16 HISTORY — PX: ORIF HUMERUS FRACTURE: SHX2126

## 2016-03-16 LAB — BASIC METABOLIC PANEL
Anion gap: 13 (ref 5–15)
BUN: 11 mg/dL (ref 6–20)
CHLORIDE: 103 mmol/L (ref 101–111)
CO2: 25 mmol/L (ref 22–32)
CREATININE: 0.68 mg/dL (ref 0.44–1.00)
Calcium: 9.8 mg/dL (ref 8.9–10.3)
GFR calc Af Amer: >60 mL/min (ref >60)
GFR calc non Af Amer: >60 mL/min (ref >60)
Glucose, Bld: 131 mg/dL — ABNORMAL HIGH (ref 65–99)
Potassium: 4.7 mmol/L (ref 3.5–5.1)
Sodium: 141 mmol/L (ref 135–145)

## 2016-03-16 LAB — CBC
HEMATOCRIT: 36.3 % (ref 36.0–46.0)
Hemoglobin: 12 g/dL (ref 12.0–15.0)
MCH: 30.5 pg (ref 26.0–34.0)
MCHC: 33.1 g/dL (ref 30.0–36.0)
MCV: 92.1 fL (ref 78.0–100.0)
Platelets: 313 10*3/uL (ref 150–400)
RBC: 3.94 MIL/uL (ref 3.87–5.11)
RDW: 13.8 % (ref 11.5–15.5)
WBC: 8.8 10*3/uL (ref 4.0–10.5)

## 2016-03-16 SURGERY — OPEN REDUCTION INTERNAL FIXATION (ORIF) PROXIMAL HUMERUS FRACTURE
Anesthesia: Regional | Site: Arm Upper | Laterality: Right

## 2016-03-16 MED ORDER — LACTATED RINGERS IV SOLN
INTRAVENOUS | Status: DC
  Administered 2016-03-16 (×2): via INTRAVENOUS

## 2016-03-16 MED ORDER — METOCLOPRAMIDE HCL 5 MG/ML IJ SOLN: 5.0000 mg | Freq: Three times a day (TID) | INTRAMUSCULAR | Status: DC | PRN

## 2016-03-16 MED ORDER — CEFAZOLIN SODIUM-DEXTROSE 2-4 GM/100ML-% IV SOLN
2.0000 g | Freq: Four times a day (QID) | INTRAVENOUS | Status: AC
  Administered 2016-03-16 – 2016-03-17 (×3): 2 g via INTRAVENOUS
  Filled 2016-03-16 (×3): qty 100

## 2016-03-16 MED ORDER — MIDAZOLAM HCL 2 MG/2ML IJ SOLN
INTRAMUSCULAR | Status: AC
  Administered 2016-03-16: 2 mg
  Filled 2016-03-16: qty 2

## 2016-03-16 MED ORDER — PHENYLEPHRINE HCL 10 MG/ML IJ SOLN
10.0000 mg | INTRAVENOUS | Status: DC | PRN
  Administered 2016-03-16: 50 ug/min via INTRAVENOUS

## 2016-03-16 MED ORDER — ARTIFICIAL TEARS OP OINT
TOPICAL_OINTMENT | OPHTHALMIC | Status: DC | PRN
  Administered 2016-03-16: 1 via OPHTHALMIC

## 2016-03-16 MED ORDER — FENTANYL CITRATE (PF) 100 MCG/2ML IJ SOLN: 25.0000 ug | INTRAMUSCULAR | Status: DC | PRN

## 2016-03-16 MED ORDER — FENTANYL CITRATE (PF) 100 MCG/2ML IJ SOLN
INTRAMUSCULAR | Status: AC
  Administered 2016-03-16: 50 ug
  Filled 2016-03-16: qty 2

## 2016-03-16 MED ORDER — PANTOPRAZOLE SODIUM 40 MG PO TBEC
40.0000 mg | DELAYED_RELEASE_TABLET | Freq: Two times a day (BID) | ORAL | Status: DC
  Administered 2016-03-16 – 2016-03-17 (×2): 40 mg via ORAL
  Filled 2016-03-16 (×2): qty 1

## 2016-03-16 MED ORDER — ONDANSETRON HCL 4 MG/2ML IJ SOLN
4.0000 mg | Freq: Four times a day (QID) | INTRAMUSCULAR | Status: DC | PRN
  Administered 2016-03-16 – 2016-03-17 (×2): 4 mg via INTRAVENOUS
  Filled 2016-03-16 (×2): qty 2

## 2016-03-16 MED ORDER — METOCLOPRAMIDE HCL 5 MG PO TABS: 5.0000 mg | ORAL_TABLET | Freq: Three times a day (TID) | ORAL | Status: DC | PRN

## 2016-03-16 MED ORDER — FENTANYL CITRATE (PF) 250 MCG/5ML IJ SOLN
INTRAMUSCULAR | Status: DC | PRN
  Administered 2016-03-16: 75 ug via INTRAVENOUS
  Administered 2016-03-16 (×2): 25 ug via INTRAVENOUS

## 2016-03-16 MED ORDER — MIDAZOLAM HCL 2 MG/2ML IJ SOLN
INTRAMUSCULAR | Status: AC
  Filled 2016-03-16: qty 2

## 2016-03-16 MED ORDER — CEFAZOLIN SODIUM-DEXTROSE 2-4 GM/100ML-% IV SOLN
2.0000 g | INTRAVENOUS | Status: AC
  Administered 2016-03-16: 2 g via INTRAVENOUS
  Filled 2016-03-16: qty 100

## 2016-03-16 MED ORDER — DIPHENHYDRAMINE HCL 12.5 MG/5ML PO ELIX: 12.5000 mg | ORAL_SOLUTION | ORAL | Status: DC | PRN

## 2016-03-16 MED ORDER — GLYCOPYRROLATE 0.2 MG/ML IJ SOLN
INTRAMUSCULAR | Status: DC | PRN
  Administered 2016-03-16: 0.1 mg via INTRAVENOUS

## 2016-03-16 MED ORDER — CHLORHEXIDINE GLUCONATE 4 % EX LIQD: 60.0000 mL | Freq: Once | CUTANEOUS | Status: DC

## 2016-03-16 MED ORDER — LACTATED RINGERS IV SOLN
INTRAVENOUS | Status: DC
  Administered 2016-03-16 – 2016-03-17 (×2): via INTRAVENOUS

## 2016-03-16 MED ORDER — POLYETHYLENE GLYCOL 3350 17 G PO PACK: 17.0000 g | PACK | Freq: Every day | ORAL | Status: DC | PRN

## 2016-03-16 MED ORDER — OXYCODONE HCL 5 MG PO TABS: 5.0000 mg | ORAL_TABLET | Freq: Once | ORAL | Status: DC | PRN

## 2016-03-16 MED ORDER — ACETAMINOPHEN 325 MG PO TABS: 650.0000 mg | ORAL_TABLET | Freq: Four times a day (QID) | ORAL | Status: DC | PRN

## 2016-03-16 MED ORDER — HYDROMORPHONE HCL 1 MG/ML IJ SOLN
1.0000 mg | INTRAMUSCULAR | Status: DC | PRN
  Administered 2016-03-16: 1 mg via INTRAVENOUS
  Filled 2016-03-16: qty 1

## 2016-03-16 MED ORDER — FENTANYL CITRATE (PF) 250 MCG/5ML IJ SOLN
INTRAMUSCULAR | Status: AC
  Filled 2016-03-16: qty 5

## 2016-03-16 MED ORDER — PHENOL 1.4 % MT LIQD: 1.0000 | OROMUCOSAL | Status: DC | PRN

## 2016-03-16 MED ORDER — ACETAMINOPHEN 650 MG RE SUPP: 650.0000 mg | Freq: Four times a day (QID) | RECTAL | Status: DC | PRN

## 2016-03-16 MED ORDER — ASPIRIN EC 81 MG PO TBEC
81.0000 mg | DELAYED_RELEASE_TABLET | Freq: Every day | ORAL | Status: DC
  Administered 2016-03-16 – 2016-03-17 (×2): 81 mg via ORAL
  Filled 2016-03-16 (×4): qty 1

## 2016-03-16 MED ORDER — PROPOFOL 10 MG/ML IV BOLUS
INTRAVENOUS | Status: AC
  Filled 2016-03-16: qty 20

## 2016-03-16 MED ORDER — ARTIFICIAL TEARS OP OINT
TOPICAL_OINTMENT | OPHTHALMIC | Status: AC
  Filled 2016-03-16: qty 3.5

## 2016-03-16 MED ORDER — SUCCINYLCHOLINE CHLORIDE 20 MG/ML IJ SOLN
INTRAMUSCULAR | Status: DC | PRN
  Administered 2016-03-16: 100 mg via INTRAVENOUS

## 2016-03-16 MED ORDER — LIDOCAINE HCL (CARDIAC) 20 MG/ML IV SOLN
INTRAVENOUS | Status: DC | PRN
  Administered 2016-03-16: 60 mg via INTRATRACHEAL

## 2016-03-16 MED ORDER — BUPIVACAINE-EPINEPHRINE (PF) 0.5% -1:200000 IJ SOLN
INTRAMUSCULAR | Status: DC | PRN
  Administered 2016-03-16: 30 mL via PERINEURAL

## 2016-03-16 MED ORDER — MENTHOL 3 MG MT LOZG: 1.0000 | LOZENGE | OROMUCOSAL | Status: DC | PRN

## 2016-03-16 MED ORDER — SUCCINYLCHOLINE CHLORIDE 200 MG/10ML IV SOSY
PREFILLED_SYRINGE | INTRAVENOUS | Status: AC
  Filled 2016-03-16: qty 10

## 2016-03-16 MED ORDER — LIDOCAINE 2% (20 MG/ML) 5 ML SYRINGE
INTRAMUSCULAR | Status: AC
  Filled 2016-03-16: qty 5

## 2016-03-16 MED ORDER — ONDANSETRON HCL 4 MG/2ML IJ SOLN: 4.0000 mg | Freq: Four times a day (QID) | INTRAMUSCULAR | Status: DC | PRN

## 2016-03-16 MED ORDER — BUPIVACAINE-EPINEPHRINE (PF) 0.5% -1:200000 IJ SOLN
INTRAMUSCULAR | Status: AC
  Filled 2016-03-16: qty 30

## 2016-03-16 MED ORDER — GLYCOPYRROLATE 0.2 MG/ML IV SOSY
PREFILLED_SYRINGE | INTRAVENOUS | Status: AC
  Filled 2016-03-16: qty 3

## 2016-03-16 MED ORDER — FENTANYL CITRATE (PF) 100 MCG/2ML IJ SOLN
INTRAMUSCULAR | Status: AC
  Filled 2016-03-16: qty 2

## 2016-03-16 MED ORDER — MAGNESIUM CITRATE PO SOLN: 1.0000 | Freq: Once | ORAL | Status: DC | PRN

## 2016-03-16 MED ORDER — ONDANSETRON HCL 4 MG PO TABS: 4.0000 mg | ORAL_TABLET | Freq: Four times a day (QID) | ORAL | Status: DC | PRN

## 2016-03-16 MED ORDER — PROPOFOL 10 MG/ML IV BOLUS
INTRAVENOUS | Status: DC | PRN
  Administered 2016-03-16: 150 mg via INTRAVENOUS
  Administered 2016-03-16 (×2): 25 mg via INTRAVENOUS

## 2016-03-16 MED ORDER — NEBIVOLOL HCL 5 MG PO TABS
10.0000 mg | ORAL_TABLET | Freq: Every day | ORAL | Status: DC
  Administered 2016-03-17: 10 mg via ORAL
  Filled 2016-03-16: qty 2

## 2016-03-16 MED ORDER — HYDROCODONE-ACETAMINOPHEN 7.5-325 MG PO TABS
1.0000 | ORAL_TABLET | Freq: Four times a day (QID) | ORAL | Status: DC | PRN
  Administered 2016-03-17: 2 via ORAL
  Filled 2016-03-16: qty 2

## 2016-03-16 MED ORDER — ATORVASTATIN CALCIUM 20 MG PO TABS
20.0000 mg | ORAL_TABLET | Freq: Every day | ORAL | Status: DC
  Administered 2016-03-16: 20 mg via ORAL
  Filled 2016-03-16: qty 1

## 2016-03-16 MED ORDER — BISACODYL 5 MG PO TBEC: 5.0000 mg | DELAYED_RELEASE_TABLET | Freq: Every day | ORAL | Status: DC | PRN

## 2016-03-16 MED ORDER — ONDANSETRON HCL 4 MG/2ML IJ SOLN
INTRAMUSCULAR | Status: AC
  Filled 2016-03-16: qty 2

## 2016-03-16 MED ORDER — OXYCODONE HCL 5 MG PO TABS
5.0000 mg | ORAL_TABLET | ORAL | Status: DC | PRN
  Administered 2016-03-16: 5 mg via ORAL
  Administered 2016-03-17 (×2): 10 mg via ORAL
  Filled 2016-03-16: qty 2
  Filled 2016-03-16: qty 1
  Filled 2016-03-16: qty 2

## 2016-03-16 MED ORDER — DOCUSATE SODIUM 100 MG PO CAPS
100.0000 mg | ORAL_CAPSULE | Freq: Two times a day (BID) | ORAL | Status: DC
  Administered 2016-03-16 – 2016-03-17 (×2): 100 mg via ORAL
  Filled 2016-03-16 (×2): qty 1

## 2016-03-16 MED ORDER — ONDANSETRON HCL 4 MG/2ML IJ SOLN
INTRAMUSCULAR | Status: DC | PRN
  Administered 2016-03-16: 4 mg via INTRAVENOUS

## 2016-03-16 MED ORDER — OXYCODONE HCL 5 MG/5ML PO SOLN: 5.0000 mg | Freq: Once | ORAL | Status: DC | PRN

## 2016-03-16 MED ORDER — DIAZEPAM 2 MG PO TABS
2.0000 mg | ORAL_TABLET | Freq: Four times a day (QID) | ORAL | Status: DC | PRN
  Administered 2016-03-16 – 2016-03-17 (×2): 2 mg via ORAL
  Filled 2016-03-16 (×2): qty 1

## 2016-03-16 SURGICAL SUPPLY — 67 items
BIT DRILL 3.2 (BIT) ×2
BIT DRILL 3.2XCALB NS DISP (BIT) ×1 IMPLANT
BIT DRILL CALIBRATED 2.7 (BIT) ×2 IMPLANT
BIT DRILL CALIBRATED 2.7MM (BIT) ×1
BIT DRL 3.2XCALB NS DISP (BIT) ×1
BONE CANC CHIPS 20CC PCAN1/4 (Bone Implant) ×3 IMPLANT
CHIPS CANC BONE 20CC PCAN1/4 (Bone Implant) ×1 IMPLANT
COVER SURGICAL LIGHT HANDLE (MISCELLANEOUS) ×3 IMPLANT
DERMABOND ADVANCED (GAUZE/BANDAGES/DRESSINGS) ×2
DERMABOND ADVANCED .7 DNX12 (GAUZE/BANDAGES/DRESSINGS) ×1 IMPLANT
DRAPE C-ARM 42X72 X-RAY (DRAPES) ×3 IMPLANT
DRAPE INCISE IOBAN 66X45 STRL (DRAPES) ×3 IMPLANT
DRAPE ORTHO SPLIT 77X108 STRL (DRAPES) ×4
DRAPE SURG ORHT 6 SPLT 77X108 (DRAPES) ×2 IMPLANT
DRAPE U-SHAPE 47X51 STRL (DRAPES) ×3 IMPLANT
DRSG AQUACEL AG ADV 3.5X10 (GAUZE/BANDAGES/DRESSINGS) ×3 IMPLANT
DRSG MEPILEX BORDER 4X8 (GAUZE/BANDAGES/DRESSINGS) ×3 IMPLANT
DURAPREP 26ML APPLICATOR (WOUND CARE) ×3 IMPLANT
ELECT REM PT RETURN 9FT ADLT (ELECTROSURGICAL) ×3
ELECTRODE REM PT RTRN 9FT ADLT (ELECTROSURGICAL) ×1 IMPLANT
GLOVE BIO SURGEON STRL SZ7.5 (GLOVE) ×3 IMPLANT
GLOVE BIO SURGEON STRL SZ8 (GLOVE) ×3 IMPLANT
GLOVE EUDERMIC 7 POWDERFREE (GLOVE) ×6 IMPLANT
GLOVE SS BIOGEL STRL SZ 7.5 (GLOVE) ×2 IMPLANT
GLOVE SUPERSENSE BIOGEL SZ 7.5 (GLOVE) ×4
GOWN STRL REUS W/ TWL LRG LVL3 (GOWN DISPOSABLE) ×1 IMPLANT
GOWN STRL REUS W/ TWL XL LVL3 (GOWN DISPOSABLE) ×2 IMPLANT
GOWN STRL REUS W/TWL LRG LVL3 (GOWN DISPOSABLE) ×2
GOWN STRL REUS W/TWL XL LVL3 (GOWN DISPOSABLE) ×4
K-WIRE 2X5 SS THRDED S3 (WIRE) ×3
KIT BASIN OR (CUSTOM PROCEDURE TRAY) ×3 IMPLANT
KIT ROOM TURNOVER OR (KITS) ×6 IMPLANT
KWIRE 2X5 SS THRDED S3 (WIRE) ×1 IMPLANT
MANIFOLD NEPTUNE II (INSTRUMENTS) ×3 IMPLANT
NDL SUT .5 MAYO 1.404X.05X (NEEDLE) IMPLANT
NEEDLE 22X1 1/2 (OR ONLY) (NEEDLE) ×3 IMPLANT
NEEDLE MAYO TAPER (NEEDLE)
NS IRRIG 1000ML POUR BTL (IV SOLUTION) ×3 IMPLANT
PACK SHOULDER (CUSTOM PROCEDURE TRAY) ×3 IMPLANT
PAD ARMBOARD 7.5X6 YLW CONV (MISCELLANEOUS) ×6 IMPLANT
PEG LOCKING 3.2MMX54 (Peg) ×3 IMPLANT
PEG LOCKING 3.2X32 (Peg) ×3 IMPLANT
PEG LOCKING 3.2X34 (Screw) ×6 IMPLANT
PEG LOCKING 3.2X38 (Screw) ×9 IMPLANT
PEG LOCKING 3.2X40 (Peg) ×6 IMPLANT
PEG LOCKING 3.2X50 (Screw) ×3 IMPLANT
PLATE PROX HUM HI R 3H 80 (Plate) ×3 IMPLANT
PUTTY BONE DBX 5CC MIX (Putty) ×3 IMPLANT
RESTRAINT HEAD UNIVERSAL NS (MISCELLANEOUS) ×3 IMPLANT
SCREW LP NL T15 3.5X24 (Screw) ×3 IMPLANT
SCREW LP NL T15 3.5X26 (Screw) ×6 IMPLANT
SLEEVE MEASURING 3.2 (BIT) ×3 IMPLANT
SLING ULTRA II LARGE (SOFTGOODS) ×3 IMPLANT
SPONGE LAP 18X18 X RAY DECT (DISPOSABLE) ×3 IMPLANT
SUCTION FRAZIER HANDLE 10FR (MISCELLANEOUS) ×2
SUCTION TUBE FRAZIER 10FR DISP (MISCELLANEOUS) ×1 IMPLANT
SUT FIBERWIRE #2 38 T-5 BLUE (SUTURE)
SUT MNCRL AB 3-0 PS2 18 (SUTURE) ×3 IMPLANT
SUT VIC AB 1 CT1 27 (SUTURE) ×2
SUT VIC AB 1 CT1 27XBRD ANTBC (SUTURE) ×1 IMPLANT
SUT VIC AB 2-0 CT1 27 (SUTURE) ×4
SUT VIC AB 2-0 CT1 TAPERPNT 27 (SUTURE) ×2 IMPLANT
SUTURE FIBERWR #2 38 T-5 BLUE (SUTURE) IMPLANT
SYR CONTROL 10ML LL (SYRINGE) ×3 IMPLANT
TOWEL OR 17X24 6PK STRL BLUE (TOWEL DISPOSABLE) ×3 IMPLANT
TOWEL OR 17X26 10 PK STRL BLUE (TOWEL DISPOSABLE) ×3 IMPLANT
WATER STERILE IRR 1000ML POUR (IV SOLUTION) ×3 IMPLANT

## 2016-03-16 NOTE — Transfer of Care (Signed)
Immediate Anesthesia Transfer of Care Note  Patient: [name]  Procedure(s) Performed: Procedure(s): OPEN REDUCTION INTERNAL FIXATION (ORIF) RIGHT PROXIMAL HUMERUS FRACTURE (Right)  Patient Location: PACU  Anesthesia Type:General and Regional  Level of Consciousness: awake, alert , oriented and patient cooperative  Airway & Oxygen Therapy: Patient Spontanous Breathing and Patient connected to face mask oxygen  Post-op Assessment: Report given to RN, Post -op Vital signs reviewed and stable and Patient moving all extremities X 4  Post vital signs: Reviewed and stable  Last Vitals:  Filed Vitals:   03/16/16 0947 03/16/16 0952  BP: 145/44 140/44  Pulse: 82 88  Temp:    Resp: 18 27    Last Pain:  Filed Vitals:   03/16/16 0953  PainSc: 7          Complications: No apparent anesthesia complications

## 2016-03-16 NOTE — Progress Notes (Addendum)
NURSING PROGRESS NOTE  ONESTY WARDWELL [id number] Admission Data: 03/16/2016 8:42 PM Attending Provider: Justice Britain, MD [id number], MD Code Status: full  [name] is a 82 y.o. female patient admitted from ED:  -No acute distress noted.  -No complaints of shortness of breath.  -No complaints of chest pain.   Cardiac Monitoring: NA  Blood pressure 108/54, pulse 71, temperature 98.4 F (36.9 C), temperature source Oral, resp. rate 20, height 5\' 4"  (1.626 m), weight 72.576 kg (160 lb), SpO2 99 %.   IV Fluids:  IV in place, occlusive dsg intact without redness, IV cath hand left, condition patent and no redness lactated Ringer's.   Allergies:  Review of patient's allergies indicates no known allergies.  Past Medical History:   has a past medical history of Hypertension; Hyperlipidemia; Seasonal allergies; GERD (gastroesophageal reflux disease); Elevated lipase; Vasomotor instability; Reactive airway disease; Peptic ulcer; Inflammatory arthritis ([place]); Osteoarthritis; Lichen planus; Vaginal atrophy; Valvular disease; Chronic cough; Heart murmur; Dysrhythmia; and Cancer of right breast ([place]) (02/16/15).  Past Surgical History:   has past surgical history that includes Carpal tunnel release (Right); Simple mastectomy with axillary sentinel node biopsy (Right, 03/19/2015); Sentinel node biopsy (Right, 03/19/2015); Axillary lymph node biopsy (Right, 03/19/2015); Vaginoplasty; Colonoscopy w/ polypectomy; Esophagogastroduodenoscopy (egd) with propofol (N/A, 07/28/2015); Breast excisional biopsy (Left, ~ 2012); Colonoscopy; ORIF proximal humerus fracture (Right, 03/16/2016); Mastectomy; Breast lumpectomy (Left, 2012); and Breast biopsy (Right, 02/16/15).  Social History:   reports that she quit smoking about 49 years ago. She has never used smokeless tobacco. She reports that she drinks about 4.2 oz of alcohol per week. She reports that she does not use illicit drugs.  Skin: intact with foam  dsg covering right shoulder incision  Patient/Family orientated to room. Information packet given to patient/family. Admission inpatient armband information verified with patient/family to include name and date of birth and placed on patient arm. Side rails up x 2, fall assessment and education completed with patient/family. Patient/family able to verbalize understanding of risk associated with falls and verbalized understanding to call for assistance before getting out of bed. Call light within reach. Patient/family able to voice and demonstrate understanding of unit orientation instructions.    Will continue to evaluate and treat per MD orders.

## 2016-03-16 NOTE — Anesthesia Procedure Notes (Addendum)
Anesthesia Regional Block:  Interscalene brachial plexus block  Pre-Anesthetic Checklist: ,, timeout performed, Correct Patient, Correct Site, Correct Laterality, Correct Procedure, Correct Position, site marked, Risks and benefits discussed,  Surgical consent,  Pre-op evaluation,  At surgeon's request and post-op pain management  Laterality: Right  Prep: chloraprep       Needles:  Injection technique: Single-shot  Needle Type: Echogenic Stimulator Needle     Needle Length: 5cm 5 cm Needle Gauge: 22 and 22 G    Additional Needles:  Procedures: ultrasound guided (picture in chart) and nerve stimulator Interscalene brachial plexus block  Nerve Stimulator or Paresthesia:  Response: biceps flexion, 0.45 mA,   Additional Responses:   Narrative:  Start time: 03/16/2016 10:06 AM End time: 03/16/2016 10:16 AM Injection made incrementally with aspirations every 5 mL.  Performed by: Personally  Anesthesiologist: HODIERNE, ADAM  Additional Notes: Functioning IV was confirmed and monitors were applied.  A 55mm 22ga Arrow echogenic stimulator needle was used. Sterile prep and drape,hand hygiene and sterile gloves were used.  Negative aspiration and negative test dose prior to incremental administration of local anesthetic. The patient tolerated the procedure well.  Ultrasound guidance: relevent anatomy identified, needle position confirmed, local anesthetic spread visualized around nerve(s), vascular puncture avoided.  Image printed for medical record.    Procedure Name: Intubation Date/Time: 03/16/2016 10:39 AM Performed by: Collier Bullock Pre-anesthesia Checklist: Patient identified, Emergency Drugs available, Suction available and Patient being monitored Patient Re-evaluated:Patient Re-evaluated prior to inductionOxygen Delivery Method: Circle system utilized Preoxygenation: Pre-oxygenation with 100% oxygen Intubation Type: IV induction Laryngoscope Size: Mac and 3 Grade  View: Grade II Tube type: Oral Tube size: 7.0 mm Number of attempts: 1 Airway Equipment and Method: Stylet Placement Confirmation: ETT inserted through vocal cords under direct vision,  positive ETCO2 and breath sounds checked- equal and bilateral Secured at: 23 cm Dental Injury: Teeth and Oropharynx as per pre-operative assessment

## 2016-03-16 NOTE — Op Note (Signed)
03/16/2016  12:26 PM  PATIENT:   [name]  82 y.o. female  PRE-OPERATIVE DIAGNOSIS:  DISPLACED RIGHT PROXIMAL HUMERUS FRACTURE   POST-OPERATIVE DIAGNOSIS:  SAME  PROCEDURE:  ORIF  SURGEON:  Myca Perno, Metta Clines. M.D.  ASSISTANTS: Shuford pac   ANESTHESIA:   GET + ISB  EBL: 300   SPECIMEN:  none  Drains: none   PATIENT DISPOSITION:  PACU - hemodynamically stable.    PLAN OF CARE: Admit for overnight observation  Dictation# [id number]   Contact # [phone]

## 2016-03-16 NOTE — H&P (Signed)
[name]    Chief Complaint: RIGHT PROXIMAL HUMERUS FRACTURE  HPI: The patient is a 82 y.o. female with a severely displaced right proximal humerus fracture  Past Medical History  Diagnosis Date  . Hypertension     Unspecified  . Hyperlipidemia     Mixed  . Seasonal allergies   . GERD (gastroesophageal reflux disease)   . Elevated lipase   . Vasomotor instability   . Reactive airway disease   . Peptic ulcer   . Inflammatory arthritis ([place])   . Osteoarthritis     hands, cervical spine and trigger nodules  . Lichen planus     oral and vulvar  . Vaginal atrophy   . Valvular disease   . Chronic cough   . Cancer ([place]) 02/16/15    Right breast, T1c, NO, [place]; ER/PR pos. Her 2 neg  . Heart murmur   . Dysrhythmia     Past Surgical History  Procedure Laterality Date  . Breast lumpectomy Left 2012  . Carpal tunnel release Right   . Simple mastectomy with axillary sentinel node biopsy Right 03/19/2015    Procedure: right total MASTECTOMY with sentinel node biopsy ;  Surgeon: Christene Lye, MD;  Location: [facility];  Service: General;  Laterality: Right;  . Sentinel node biopsy Right 03/19/2015    Procedure: SENTINEL NODE BIOPSY;  Surgeon: Christene Lye, MD;  Location: [facility];  Service: General;  Laterality: Right;  . Axillary lymph node biopsy Right 03/19/2015    Procedure: AXILLARY LYMPH NODE BIOPSY;  Surgeon: Christene Lye, MD;  Location: [facility];  Service: General;  Laterality: Right;  . Vaginoplasty    . Colon polyp remo    . Esophagogastroduodenoscopy (egd) with propofol N/A 07/28/2015    Procedure: ESOPHAGOGASTRODUODENOSCOPY (EGD) WITH PROPOFOL;  Surgeon: Manya Silvas, MD;  Location: [place];  Service: Endoscopy;  Laterality: N/A;  . Mastectomy Right 03/19/2015    +  . Breast biopsy Right 02/16/15  . Breast excisional biopsy Left   . Breast biopsy Left   . Colonoscopy      Family History  Problem Relation Age of Onset  . Colon cancer  Paternal Grandfather 64  . Colon cancer Paternal Uncle 53  . Heart disease Father   . Heart disease Mother   . Diabetes Mother   . Diabetes Daughter     Social History:  reports that she quit smoking about 49 years ago. She has never used smokeless tobacco. She reports that she drinks about 4.2 oz of alcohol per week. She reports that she does not use illicit drugs.   Medications Prior to Admission  Medication Sig Dispense Refill  . aspirin 81 MG EC tablet Take 81 mg by mouth daily.      Marland Kitchen atorvastatin (LIPITOR) 20 MG tablet Take 20 mg by mouth at bedtime.     . Calcium Carbonate-Vitamin D (CALCIUM-VITAMIN D3) 600-200 MG-UNIT TABS Take 1 tablet by mouth 2 (two) times daily.      . diazepam (VALIUM) 2 MG tablet Take 2 mg by mouth every 6 (six) hours as needed for anxiety.    Marland Kitchen HYDROcodone-acetaminophen (NORCO) 5-325 MG tablet Take 1-2 tablets by mouth every 4 (four) hours as needed for moderate pain. 15 tablet 0  . letrozole (FEMARA) 2.5 MG tablet Take 1 tablet (2.5 mg total) by mouth daily. 30 tablet 2  . Multiple Vitamins-Minerals (CENTRUM) tablet Take 1 tablet by mouth daily.      . nebivolol (  BYSTOLIC) 10 MG tablet TAKE 1 TABLET ONCE DAILY   PLEASE MAKE AN APPOINTMENT WITH YOUR DOCTOR    . ondansetron (ZOFRAN) 4 MG tablet Take 1 tablet (4 mg total) by mouth daily as needed for nausea or vomiting. 30 tablet 1  . pantoprazole (PROTONIX) 40 MG tablet Take 40 mg by mouth 2 (two) times daily.      Marland Kitchen PATADAY 0.2 % SOLN Place 1 drop into both eyes daily as needed (allergies).        Physical Exam: right shoulder with diffuse swelling, tenderness, and ecchymosis as noted in office Monday, remains grossly N/V intact  Vitals  Temp:  [98.5 F (36.9 C)] 98.5 F (36.9 C) (05/11 0832) Pulse Rate:  [69] 69 (05/11 0832) Resp:  [20] 20 (05/11 0832) BP: (152)/(63) 152/63 mmHg (05/11 0832) SpO2:  [100 %] 100 % (05/11 0832) Weight:  [72.576 kg (160 lb)] 72.576 kg (160 lb) (05/11  0832)  Assessment/Plan  Impression: SEVERELY DISPLACED RIGHT PROXIMAL HUMERUS FRACTURE   Plan of Action: Procedure(s): OPEN REDUCTION INTERNAL FIXATION (ORIF) RIGHT PROXIMAL HUMERUS FRACTURE  Kaine Mcquillen M Gloria Ricardo 03/16/2016, 9:37 AM Contact # [phone]

## 2016-03-16 NOTE — Anesthesia Preprocedure Evaluation (Signed)
Anesthesia Evaluation  Patient identified by MRN, date of birth, ID band Patient awake    Reviewed: Allergy & Precautions, NPO status , Patient's Chart, lab work & pertinent test results  Airway Mallampati: II   Neck ROM: full    Dental   Pulmonary former smoker,    breath sounds clear to auscultation       Cardiovascular hypertension,  Rhythm:regular Rate:Normal     Neuro/Psych    GI/Hepatic PUD, GERD  ,  Endo/Other    Renal/GU      Musculoskeletal  (+) Arthritis ,   Abdominal   Peds  Hematology   Anesthesia Other Findings   Reproductive/Obstetrics                             Anesthesia Physical Anesthesia Plan  ASA: III  Anesthesia Plan: General and Regional   Post-op Pain Management:    Induction: Intravenous  Airway Management Planned: LMA  Additional Equipment:   Intra-op Plan:   Post-operative Plan:   Informed Consent: I have reviewed the patients History and Physical, chart, labs and discussed the procedure including the risks, benefits and alternatives for the proposed anesthesia with the patient or authorized representative who has indicated his/her understanding and acceptance.     Plan Discussed with: CRNA, Anesthesiologist and Surgeon  Anesthesia Plan Comments:         Anesthesia Quick Evaluation

## 2016-03-17 ENCOUNTER — Encounter (HOSPITAL_COMMUNITY): Payer: Self-pay | Admitting: Orthopedic Surgery

## 2016-03-17 DIAGNOSIS — S42201A Unspecified fracture of upper end of right humerus, initial encounter for closed fracture: Secondary | ICD-10-CM | POA: Diagnosis not present

## 2016-03-17 MED ORDER — OXYCODONE-ACETAMINOPHEN 5-325 MG PO TABS: 1.0000 | ORAL_TABLET | ORAL | Status: DC | PRN

## 2016-03-17 MED ORDER — DIAZEPAM 2 MG PO TABS: 2.0000 mg | ORAL_TABLET | Freq: Four times a day (QID) | ORAL | Status: DC | PRN

## 2016-03-17 MED ORDER — ONDANSETRON HCL 4 MG PO TABS: 4.0000 mg | ORAL_TABLET | Freq: Three times a day (TID) | ORAL | Status: DC | PRN

## 2016-03-17 NOTE — Care Management Obs Status (Signed)
[place] NOTIFICATION   Patient Details  Name: [name] MRN: [id number] Date of Birth: 1933/06/14   Medicare Observation Status Notification Given:  Yes    Nila Nephew, RN 03/17/2016, 10:38 AM

## 2016-03-17 NOTE — Progress Notes (Signed)
Occupational Therapy Treatment/Discharge Patient Details Name: [name] MRN: [id number] DOB: 15-Dec-1932 Today's Date: 03/17/2016    History of present illness 82 y.o. female s/p ORIF R proximal humerus fx. PMH significant for HTN, HLD, GERD, vasomotor instability, reactive airway disease, osteoarthritis, lichen planus, chronic cough, R breast CA, heart murmur, and dysrhythmia.    OT comments  Pt completed R hand and wrist exercises while still in sling, but was uanble to attempt R elbow exercises this session due to increased pain. Reviewed UB dressing tasks with pt and pt's daughter and answered all remaining questions regarding ADLs and progression of therapy. All education has been completed and pt has no further questions. OT signing off.   Follow Up Recommendations  Other (comment) (Progress shoulder rehab at MD discretion)    Equipment Recommendations  None recommended by OT    Recommendations for Other Services      Precautions / Restrictions Precautions Precautions: Shoulder Type of Shoulder Precautions: No shoulder AROM, AROM hand, wrist, elbow allowed Shoulder Interventions: Shoulder sling/immobilizer;Off for dressing/bathing/exercises Precaution Booklet Issued: Yes (comment) Precaution Comments: Reviewed shoulder precautions and handout with pt, husband and daughter Required Braces or Orthoses: Sling Restrictions Weight Bearing Restrictions: Yes RUE Weight Bearing: Non weight bearing       Mobility Bed Mobility Overal bed mobility: Needs Assistance Bed Mobility: Supine to Sit     Supine to sit: Min assist;HOB elevated     General bed mobility comments: Pt up in chair on OT arrival  Transfers Overall transfer level: Needs assistance Equipment used: None Transfers: Sit to/from Stand Sit to Stand: Min guard         General transfer comment: Not attempted this session. Focus of session on UB ADLs and HEP    Balance Overall balance assessment: Needs  assistance Sitting-balance support: No upper extremity supported;Feet supported Sitting balance-Leahy Scale: Fair Sitting balance - Comments: limited due to shoulder pain   Standing balance support: No upper extremity supported;During functional activity Standing balance-Leahy Scale: Good                     ADL Overall ADL's : Needs assistance/impaired Eating/Feeding: Set up;Sitting Eating/Feeding Details (indicate cue type and reason): set up including opening containers, packets, etc... Grooming: Wash/dry hands;Min guard;Standing   Upper Body Bathing: Maximal assistance;Cueing for UE precautions;Cueing for compensatory techniques;Sitting   Lower Body Bathing: Moderate assistance;Sit to/from stand;Cueing for compensatory techniques;Cueing for safety   Upper Body Dressing : Moderate assistance;Adhering to UE precautions;With caregiver independent assisting;Sitting   Lower Body Dressing: Moderate assistance;Sit to/from stand;With caregiver independent assisting   Toilet Transfer: Min guard;Cueing for safety;Ambulation;Comfort height toilet Toilet Transfer Details (indicate cue type and reason): Cues to feel toilet on back of legs before reaching back to sit Toileting- Clothing Manipulation and Hygiene: Min guard;Sit to/from stand       Functional mobility during ADLs: Min guard General ADL Comments: Focus of session on HEP and answering final questions before discharge      Vision                     Perception     Praxis      Cognition   Behavior During Therapy: [facility] for tasks assessed/performed Overall Cognitive Status: Within Functional Limits for tasks assessed                       Extremity/Trunk Assessment  Upper Extremity Assessment Upper Extremity Assessment: RUE deficits/detail RUE  Deficits / Details: decreased ROM and strength as expected post op RUE: Unable to fully assess due to pain;Unable to fully assess due to  immobilization RUE Coordination: decreased gross motor   Lower Extremity Assessment Lower Extremity Assessment: Overall WFL for tasks assessed   Cervical / Trunk Assessment Cervical / Trunk Assessment: Normal    Exercises General Exercises - Upper Extremity Elbow Flexion: AROM;AAROM;Right;10 reps;Supine;Seated Elbow Extension: AROM;AAROM;Right;10 reps;Seated;Supine Wrist Flexion: AROM;Right;10 reps;Supine;Seated Wrist Extension: AROM;Right;10 reps;Supine;Seated Digit Composite Flexion: AROM;Right;10 reps;Supine;Seated Composite Extension: AROM;Right;10 reps;Seated;Supine   Shoulder Instructions       General Comments      Pertinent Vitals/ Pain       Pain Assessment: 0-10 Pain Score: 8  Pain Location: R shoulder Pain Descriptors / Indicators: Aching;Sore Pain Intervention(s): Limited activity within patient's tolerance;Monitored during session;Premedicated before session;Repositioned;Ice applied  Home Living Family/patient expects to be discharged to:: Private residence Living Arrangements: Spouse/significant other Available Help at Discharge: Family;Available 24 hours/day Type of Home: House Home Access: Stairs to enter [employer] of Steps: 2 Entrance Stairs-Rails: None Home Layout: Two level;Bed/bath upstairs Alternate Level Stairs-Number of Steps: flight Alternate Level Stairs-Rails: Right Bathroom Shower/Tub: Walk-in shower;Door   Bathroom Toilet: Handicapped height     Home Equipment: Environmental consultant - 2 wheels;Hand held shower head          Prior Functioning/Environment Level of Independence: Independent            Frequency Min 2X/week     Progress Toward Goals  OT Goals(current goals can now be found in the care plan section)  Progress towards OT goals: Goals met/education completed, patient discharged from OT  Acute Rehab OT Goals Patient Stated Goal: to get better OT Goal Formulation: With patient Time For Goal Achievement:  03/31/16 Potential to Achieve Goals: Good ADL Goals Pt Will Perform Upper Body Dressing: with mod assist;with caregiver independent in assisting;sitting Pt Will Perform Lower Body Dressing: with min assist;with caregiver independent in assisting;sit to/from stand Pt Will Transfer to Toilet: with supervision;ambulating;regular height toilet Pt Will Perform Toileting - Clothing Manipulation and hygiene: with supervision;sitting/lateral leans;sit to/from stand Pt Will Perform Tub/Shower Transfer: Shower transfer;with supervision;ambulating;3 in 1 Pt/caregiver will Perform Home Exercise Program: Increased ROM;Right Upper extremity;With Supervision;With written HEP provided  Plan All goals met and education completed, patient discharged from OT services    Co-evaluation                 End of Session Equipment Utilized During Treatment: Other (comment) (sling)   Activity Tolerance Patient limited by pain   Patient Left in chair;with call bell/phone within reach;with family/visitor present   Nurse Communication Mobility status    Functional Assessment Tool Used: clinical judgement Functional Limitation: Self care Self Care Current Status (M0867): At least 40 percent but less than 60 percent impaired, limited or restricted Self Care Goal Status (Y1950): At least 20 percent but less than 40 percent impaired, limited or restricted Self Care Discharge Status [phone]): At least 20 percent but less than 40 percent impaired, limited or restricted   Time: 1254-1302 OT Time Calculation (min): 8 min  Charges: OT G-codes **NOT FOR INPATIENT CLASS** Functional Assessment Tool Used: clinical judgement Functional Limitation: Self care Self Care Current Status (T2458): At least 40 percent but less than 60 percent impaired, limited or restricted Self Care Goal Status (K9983): At least 20 percent but less than 40 percent impaired, limited or restricted Self Care Discharge Status [phone]): At least  20 percent but less than 40 percent  impaired, limited or restricted OT General Charges $OT Visit: 1 Procedure OT Evaluation $OT Eval Moderate Complexity: 1 Procedure OT Treatments $Self Care/Home Management : 8-22 mins $Therapeutic Exercise: 8-22 mins  Redmond Baseman, OTR/L Pager: [phone] 03/17/2016, 1:04 PM

## 2016-03-17 NOTE — Discharge Instructions (Signed)
Ok to allow arm to dangle and to move it for hygiene. Sling on but can remove to allow arm to dangle and move elbow wrist and hand. You may shower with current dressing in place, it is waterproof. Leave this dressing in place until follow up Call [phone] for any questions or concerns. Continue laxatives as needed while on pain meds

## 2016-03-17 NOTE — Progress Notes (Signed)
Occupational Therapy Evaluation Patient Details Name: [name] MRN: [id number] DOB: 08/25/1933 Today's Date: 03/17/2016    History of Present Illness 82 y.o. female s/p ORIF R proximal humerus fx. PMH significant for HTN, HLD, GERD, vasomotor instability, reactive airway disease, osteoarthritis, lichen planus, chronic cough, R breast CA, heart murmur, and dysrhythmia.    Clinical Impression   PTA, pt was independent with ADLs and mobility. Pt currently requires max assist for UB ADLs and min guard assist for functional transfers. Began education on shoulder precautions, proper RUE positioning, sling wear protocol, and compensatory strategies for dressing/bathing tasks. Pt plans to d/c home with 24/7 assistance from her husband and daughter. Pt will benefit from continued acute OT to increase independence and safety with ADLs and mobility to allow for safe discharge home. No OT follow up - recommend 3in1 for home use.    Follow Up Recommendations  Other (comment) (Progress shoulder rehab at MD discretion)    Equipment Recommendations  3 in 1 bedside comode    Recommendations for Other Services       Precautions / Restrictions Precautions Precautions: Shoulder Type of Shoulder Precautions: No shoulder AROM, AROM hand, wrist, elbow allowed Shoulder Interventions: Shoulder sling/immobilizer;Off for dressing/bathing/exercises Precaution Booklet Issued: Yes (comment) Precaution Comments: Reviewed shoulder precautions and handout with pt, husband and daughter Required Braces or Orthoses: Sling Restrictions Weight Bearing Restrictions: Yes RUE Weight Bearing: Non weight bearing      Mobility Bed Mobility Overal bed mobility: Needs Assistance Bed Mobility: Supine to Sit     Supine to sit: Min assist;HOB elevated     General bed mobility comments: HOB elevated 30 degrees, exited on L side to simulate home environment. Min assist for trunk support to come to full sitting  position.  Transfers Overall transfer level: Needs assistance Equipment used: None Transfers: Sit to/from Stand Sit to Stand: Min guard         General transfer comment: Min guard for safety. No physical assist required, but pt taking small, staggering steps at times with narrow BoS. No dizziness reported or LOB noted.    Balance Overall balance assessment: Needs assistance Sitting-balance support: Feet supported;No upper extremity supported Sitting balance-Leahy Scale: Fair Sitting balance - Comments: limited due to shoulder pain   Standing balance support: No upper extremity supported;During functional activity Standing balance-Leahy Scale: Good                              ADL Overall ADL's : Needs assistance/impaired Eating/Feeding: Set up;Sitting Eating/Feeding Details (indicate cue type and reason): set up including opening containers, packets, etc... Grooming: Wash/dry hands;Min guard;Standing   Upper Body Bathing: Maximal assistance;Cueing for UE precautions;Cueing for compensatory techniques;Sitting   Lower Body Bathing: Moderate assistance;Sit to/from stand;Cueing for compensatory techniques;Cueing for safety           Toilet Transfer: Min guard;Cueing for safety;Ambulation;Comfort height toilet Toilet Transfer Details (indicate cue type and reason): Cues to feel toilet on back of legs before reaching back to sit Toileting- Clothing Manipulation and Hygiene: Min guard;Sit to/from stand       Functional mobility during ADLs: Min guard General ADL Comments: Began education on shoulder precautions, sling wear protocol, and compensatory strategies for UB ADLs. Pt's husband and daughter present for OT eval.     Vision Vision Assessment?: No apparent visual deficits   Perception     Praxis      Pertinent Vitals/Pain Pain Assessment: 0-10 Pain  Score: 8  Pain Location: R shoulder Pain Descriptors / Indicators: Sore Pain Intervention(s): Limited  activity within patient's tolerance;Monitored during session;Premedicated before session;Repositioned     Hand Dominance Right   Extremity/Trunk Assessment Upper Extremity Assessment Upper Extremity Assessment: RUE deficits/detail RUE Deficits / Details: decreased ROM and strength as expected post op RUE: Unable to fully assess due to pain;Unable to fully assess due to immobilization RUE Coordination: decreased gross motor   Lower Extremity Assessment Lower Extremity Assessment: Overall WFL for tasks assessed   Cervical / Trunk Assessment Cervical / Trunk Assessment: Normal   Communication Communication Communication: No difficulties   Cognition Arousal/Alertness: Lethargic;Suspect due to medications Behavior During Therapy: [facility] for tasks assessed/performed Overall Cognitive Status: Within Functional Limits for tasks assessed                     General Comments       Exercises Exercises: General Upper Extremity     Shoulder Instructions      Home Living Family/patient expects to be discharged to:: Private residence Living Arrangements: Spouse/significant other Available Help at Discharge: Family;Available 24 hours/day Type of Home: House Home Access: Stairs to enter [employer] of Steps: 2 Entrance Stairs-Rails: None Home Layout: Two level;Bed/bath upstairs Alternate Level Stairs-Number of Steps: flight Alternate Level Stairs-Rails: Right Bathroom Shower/Tub: Walk-in shower;Door   Bathroom Toilet: Handicapped height     Home Equipment: Environmental consultant - 2 wheels;Hand held shower head          Prior Functioning/Environment Level of Independence: Independent             OT Diagnosis: Acute pain   OT Problem List: Decreased strength;Decreased activity tolerance;Decreased range of motion;Impaired balance (sitting and/or standing);Decreased safety awareness;Decreased knowledge of use of DME or AE;Decreased knowledge of precautions;Pain;Impaired  UE functional use   OT Treatment/Interventions: Self-care/ADL training;Therapeutic exercise;Energy conservation;DME and/or AE instruction;Therapeutic activities;Patient/family education;Balance training    OT Goals(Current goals can be found in the care plan section) Acute Rehab OT Goals Patient Stated Goal: to get better OT Goal Formulation: With patient Time For Goal Achievement: 03/31/16 Potential to Achieve Goals: Good ADL Goals Pt Will Perform Upper Body Dressing: with mod assist;with caregiver independent in assisting;sitting Pt Will Perform Lower Body Dressing: with min assist;with caregiver independent in assisting;sit to/from stand Pt Will Transfer to Toilet: with supervision;ambulating;regular height toilet Pt Will Perform Toileting - Clothing Manipulation and hygiene: with supervision;sitting/lateral leans;sit to/from stand Pt Will Perform Tub/Shower Transfer: Shower transfer;with supervision;ambulating;3 in 1 Pt/caregiver will Perform Home Exercise Program: Increased ROM;Right Upper extremity;With Supervision;With written HEP provided  OT Frequency: Min 2X/week   Barriers to D/C:            Co-evaluation              End of Session Equipment Utilized During Treatment: Gait belt;Other (comment) (sling) Nurse Communication: Mobility status;Patient requests pain meds;Other (comment) (pt will need 2nd OT session prior to d/c)  Activity Tolerance: Patient limited by pain Patient left: in chair;with call bell/phone within reach;with family/visitor present   Time: [id number] OT Time Calculation (min): 26 min Charges:  OT General Charges $OT Visit: 1 Procedure OT Evaluation $OT Eval Moderate Complexity: 1 Procedure OT Treatments $Self Care/Home Management : 8-22 mins G-Codes: OT G-codes **NOT FOR INPATIENT CLASS** Functional Assessment Tool Used: clinical judgement Functional Limitation: Self care Self Care Current Status [id number]): At least 40 percent but less than  60 percent impaired, limited or restricted Self Care Goal Status [id number]): At least  20 percent but less than 40 percent impaired, limited or restricted  Redmond Baseman, OTR/L Pager: [phone] 03/17/2016, 11:18 AM

## 2016-03-17 NOTE — Op Note (Signed)
NAMEMALORIE, DOTY                  ACCOUNT NO.:  [id number]  MEDICAL RECORD NO.:  [id number]  LOCATION:  5N28C                        FACILITY:  [facility]  PHYSICIAN:  Metta Clines. Teresa Lemmerman, M.D.  DATE OF BIRTH:  Sep 22, 1933  DATE OF PROCEDURE:  03/16/2016 DATE OF DISCHARGE:                              OPERATIVE REPORT   PREOPERATIVE DIAGNOSIS:  Severely displaced right two-part proximal humerus fracture.  POSTOPERATIVE DIAGNOSIS:  Severely displaced right two-part proximal humerus fracture.  PROCEDURES:  Open reduction and internal fixation of displaced right two- part proximal humeral fracture with allograft bone grafting of the metaphyseal defect.  SURGEON:  Metta Clines. Jakeira Seeman, M.D.  Terrence DupontOlivia Mackie A. Shuford, P.A.-C.  ANESTHESIA:  General endotracheal as well as an interscalene block.  ESTIMATED BLOOD LOSS:  300 mL.  DRAINS:  None.  HISTORY:  Ms. Dillashaw is an 82 year old female, who fell last week sustaining a severely-displaced right two-part proximal humerus fracture.  Due to the degree of displacement and angulation, she is brought to the operating room at this time for planned open reduction and internal fixation.  Preoperatively, I counseled Ms. Diesing and family members regarding treatment options and potential risks versus benefits thereof.  Possible surgical complications were all reviewed including bleeding, infection, neurovascular injury, malunion, nonunion, loss of fixation, and possible need for additional surgery.  They understand and accept and agree with our planned procedure.  PROCEDURE IN DETAIL:  After undergoing routine preop evaluation, the patient received prophylactic antibiotics and an interscalene block was established in the holding area by the Anesthesia Department.  Placed supine on the operative table, underwent smooth induction of a general endotracheal anesthesia.  Placed in the beach-chair position and appropriately padded and protected.  The  right shoulder girdle region was sterilely prepped and draped in standard fashion.  Time-out was called.  An anterior deltopectoral approach to the right shoulder was made through a 10-cm incision.  Skin flaps were elevated.  Dissection carried deeply, significant hemorrhage in the subcutaneous tissues was noted, consistent with the traumatic injury.  The deltopectoral interval was identified developed from proximal to distal, the vein taken laterally and the upper centimeter of the pec major was tenotomized to enhance exposure and then divided adhesions beneath the deltoid.  At this point, the fracture site was readily identified and we mobilized the fracture site introducing an elevator into the fracture site to help elevate the humeral head and correct the Veress and retroflex positioning.  We used the Spillman-head biceps tendon to help me identify the overall proper alignment and positioning.  We provisionally placed a Biomet proximal humeral plate over the lateral aspect of the cortex just posterior to the bicipital groove and then used fluoroscopic imaging to confirm overall positioning, alignment and adequacy of reduction.  There was significant varus at the fracture site and tendency to fall into the Veress and we corrected this using an elevator to elevate the humeral head and once we obtained overall positioning to our satisfaction, our central guidepin was placed and the plate was then transfixed to the humeral shaft with a single screw.  As we maintained the humeral head elevator, we placed our initial pegs  up into the humeral head and confirmed overall position, alignment was satisfactory.  Upon evaluation, we found that the head had a tendency to drift into more varus and so we removed our hardware.  We reelevated the head and once again placed our screws and ultimately found that there was a tendency towards the slight varus alignment at the fracture site.  To help prevent any  further varus movement, we went ahead and introduced a combination of cortical cancellous bone chips and demineralized bone matrix into the void of the humeral head to the fracture site and then reapplied our screws and had improved the overall alignment, but still slight varus positioning, but I felt the overall alignment was very satisfactory and acceptable.  We completed the placement of the pegs up into the humeral head and the two additional screws into the humeral shaft and then, it re-evaluated the overall position and alignment and confirmed that the hardware was all properly positioned within the humeral head and overall maintenance of good alignment.  The wound was then copiously irrigated.  Hemostasis was obtained.  The deltopectoral interval was then reapproximated with a series of figure-of-eight #1 Vicryl sutures.  2-0 Vicryl was used for the subcu layer and intracuticular 3-0 Monocryl for the skin followed by Dermabond and an Aquacel dressing.  The right arm was placed in a sling.  The patient was awakened, extubated and taken to the recovery room in stable condition.  Reather Laurence Shuford, PA-C was used as an Environmental consultant throughout this case, was essential for help with positioning the patient, positioning the extremity, maintenance of the fracture alignment, manipulation of the soft tissues, wound closure, and intraoperative decision making.     Metta Clines. Larone Kliethermes, M.D.     KMS/MEDQ  D:  03/16/2016  T:  03/17/2016  Job:  [id number]

## 2016-03-17 NOTE — Discharge Summary (Signed)
PATIENT ID:      [name]  MRN:     [id number] DOB/AGE:    1933/09/02 / 82 y.o.     DISCHARGE SUMMARY  ADMISSION DATE:    03/16/2016 DISCHARGE DATE:    ADMISSION DIAGNOSIS: RIGHT PROXIMAL HUMERUS FRACTURE  Past Medical History  Diagnosis Date  . Hypertension     Unspecified  . Hyperlipidemia     Mixed  . Seasonal allergies   . GERD (gastroesophageal reflux disease)   . Elevated lipase   . Vasomotor instability   . Reactive airway disease   . Peptic ulcer   . Inflammatory arthritis ([place])   . Osteoarthritis     hands, cervical spine and trigger nodules  . Lichen planus     oral and vulvar  . Vaginal atrophy   . Valvular disease   . Chronic cough   . Heart murmur   . Dysrhythmia   . Cancer of right breast ([place]) 02/16/15    Right breast, T1c, NO, MO; ER/PR pos. Her 2 neg    DISCHARGE DIAGNOSIS:   Active Problems:   S/P ORIF (open reduction internal fixation) fracture   PROCEDURE: Procedure(s): OPEN REDUCTION INTERNAL FIXATION (ORIF) RIGHT PROXIMAL HUMERUS FRACTURE on 03/16/2016  CONSULTS:     HISTORY:  See H&P in chart.  HOSPITAL COURSE:  [name] is a 82 y.o. admitted on 03/16/2016 with a diagnosis of RIGHT PROXIMAL HUMERUS FRACTURE .  They were brought to the operating room on 03/16/2016 and underwent Procedure(s): OPEN REDUCTION INTERNAL FIXATION (ORIF) RIGHT PROXIMAL HUMERUS FRACTURE.    They were given perioperative antibiotics: Anti-infectives    Start     Dose/Rate Route Frequency Ordered Stop   03/16/16 1630  ceFAZolin (ANCEF) IVPB 2g/100 mL premix     2 g 200 mL/hr over 30 Minutes Intravenous Every 6 hours 03/16/16 1615 03/17/16 0405   03/16/16 0814  ceFAZolin (ANCEF) IVPB 2g/100 mL premix     2 g 200 mL/hr over 30 Minutes Intravenous On call to O.R. 03/16/16 [id number] 03/16/16 1041    .  Patient underwent the above named procedure and tolerated it well. The following day they were hemodynamically stable and pain was controlled on oral analgesics. They  were neurovascularly intact to the operative extremity. OT was ordered and worked with patient per protocol. They were medically and orthopaedically stable for discharge on day 1.    DIAGNOSTIC STUDIES:  RECENT RADIOGRAPHIC STUDIES :  Dg Shoulder Right  03/08/2016  CLINICAL DATA:  Fall on steps outside house today. Right shoulder pain. Initial encounter. EXAM: RIGHT SHOULDER - 2+ VIEW COMPARISON:  None. FINDINGS: A comminuted, mildly displaced fracture of the right humeral neck is seen. No other fractures identified. No evidence of dislocation. Generalized osteopenia noted. IMPRESSION: Comminuted right humeral neck fracture. Electronically Signed   By: Earle Gell M.D.   On: 03/08/2016 12:32   Dg Humerus Right  03/16/2016  CLINICAL DATA:  Elective surgery EXAM: DG C-ARM 61-120 MIN; RIGHT HUMERUS - 2+ VIEW COMPARISON:  03/08/2016 radiography FINDINGS: Status post open reduction and internal fixation of a humeral neck fracture using lateral plate and screw. The shoulder appears located and there is no evidence of intraoperative fracture. IMPRESSION: Fluoroscopy for right humeral neck fracture ORIF. Electronically Signed   By: Monte Fantasia M.D.   On: 03/16/2016 12:46   Dg C-arm 1-60 Min  03/16/2016  CLINICAL DATA:  Elective surgery EXAM: DG C-ARM 61-120 MIN; RIGHT HUMERUS - 2+ VIEW COMPARISON:  03/08/2016 radiography FINDINGS: Status post open reduction and internal fixation of a humeral neck fracture using lateral plate and screw. The shoulder appears located and there is no evidence of intraoperative fracture. IMPRESSION: Fluoroscopy for right humeral neck fracture ORIF. Electronically Signed   By: Monte Fantasia M.D.   On: 03/16/2016 12:46   Dg Hip Unilat With Pelvis 2-3 Views Left  03/08/2016  CLINICAL DATA:  [place] on steps outside house today. Left hip pain. Initial encounter. EXAM: DG HIP (WITH OR WITHOUT PELVIS) 2-3V LEFT COMPARISON:  None. FINDINGS: There is no evidence of hip fracture or  dislocation. There is no evidence of arthropathy or other focal bone abnormality. No pelvic fracture identified. Enthesopathic changes seen involving the iliac crests and ischial tuberosities. IMPRESSION: No acute findings. Electronically Signed   By: Earle Gell M.D.   On: 03/08/2016 12:35    RECENT VITAL SIGNS:  Patient Vitals for the past 24 hrs:  BP Temp Temp src Pulse Resp SpO2 Height Weight  03/17/16 0500 (!) 112/46 mmHg 98.5 F (36.9 C) Oral 79 18 99 % - -  03/17/16 0100 (!) 116/48 mmHg 98.8 F (37.1 C) Oral 90 18 96 % - -  03/16/16 2100 (!) 110/47 mmHg 99.1 F (37.3 C) Oral 87 18 99 % - -  03/16/16 1445 (!) 108/54 mmHg 98.4 F (36.9 C) Oral 71 20 99 % - -  03/16/16 1417 - 98.6 F (37 C) - - - - - -  03/16/16 1415 - - - 71 20 100 % - -  03/16/16 1411 127/65 mmHg - - 71 17 100 % - -  03/16/16 1400 - - - 70 20 100 % - -  03/16/16 1356 103/64 mmHg - - 67 20 100 % - -  03/16/16 1345 - - - 72 18 100 % - -  03/16/16 1341 (!) 121/59 mmHg - - 67 19 100 % - -  03/16/16 1330 - - - 71 (!) 21 100 % - -  03/16/16 1326 (!) 108/93 mmHg - - 68 15 100 % - -  03/16/16 1315 - - - 75 (!) 23 99 % - -  03/16/16 1311 120/64 mmHg - - 69 17 100 % - -  03/16/16 1300 - - - 71 16 100 % - -  03/16/16 1257 (!) 113/58 mmHg - - 73 18 99 % - -  03/16/16 1245 128/72 mmHg - - 79 18 100 % - -  03/16/16 1242 128/72 mmHg 98.5 F (36.9 C) - 78 (!) 24 100 % - -  03/16/16 0952 (!) 140/44 mmHg - - 88 (!) 27 98 % - -  03/16/16 0947 (!) 145/44 mmHg - - 82 18 99 % - -  03/16/16 0936 - - - 73 17 100 % - -  03/16/16 0832 (!) 152/63 mmHg 98.5 F (36.9 C) Oral 69 20 100 % 5\' 4"  (1.626 m) 72.576 kg (160 lb)  .  RECENT EKG RESULTS:    Orders placed or performed during the hospital encounter of 03/16/16  . EKG 12-Lead  . EKG 12-Lead    DISCHARGE INSTRUCTIONS:  Discharge Instructions    Discontinue IV    Complete by:  As directed            DISCHARGE MEDICATIONS:     Medication List    STOP taking these  medications        HYDROcodone-acetaminophen 5-325 MG tablet  Commonly known as:  NORCO      TAKE  these medications        aspirin 81 MG EC tablet  Take 81 mg by mouth daily.     atorvastatin 20 MG tablet  Commonly known as:  LIPITOR  Take 20 mg by mouth at bedtime.     Calcium-Vitamin D3 600-200 MG-UNIT Tabs  Take 1 tablet by mouth 2 (two) times daily.     CENTRUM tablet  Take 1 tablet by mouth daily.     diazepam 2 MG tablet  Commonly known as:  VALIUM  Take 1 tablet (2 mg total) by mouth every 6 (six) hours as needed for anxiety.     letrozole 2.5 MG tablet  Commonly known as:  FEMARA  Take 1 tablet (2.5 mg total) by mouth daily.     nebivolol 10 MG tablet  Commonly known as:  BYSTOLIC  TAKE 1 TABLET ONCE DAILY   PLEASE MAKE AN APPOINTMENT WITH YOUR DOCTOR     ondansetron 4 MG tablet  Commonly known as:  ZOFRAN  Take 1 tablet (4 mg total) by mouth daily as needed for nausea or vomiting.     ondansetron 4 MG tablet  Commonly known as:  ZOFRAN  Take 1 tablet (4 mg total) by mouth every 8 (eight) hours as needed for nausea or vomiting.     oxyCODONE-acetaminophen 5-325 MG tablet  Commonly known as:  PERCOCET  Take 1-2 tablets by mouth every 4 (four) hours as needed.     pantoprazole 40 MG tablet  Commonly known as:  PROTONIX  Take 40 mg by mouth 2 (two) times daily.     PATADAY 0.2 % Soln  Generic drug:  Olopatadine HCl  Place 1 drop into both eyes daily as needed (allergies).        FOLLOW UP VISIT:       Follow-up Information    Follow up with Metta Clines SUPPLE, MD.   Specialty:  Orthopedic Surgery   Why:  call to be seen in 10-14 days   Contact information:   [address] [place] [address] [id number]       DISCHARGE TO: Home  DISPOSITION: Good   DISCHARGE CONDITION:  Festus Barren for Dr. Justice Britain 03/17/2016, 8:07 AM

## 2016-03-17 NOTE — Care Management Note (Addendum)
Case Management Note  Patient Details  Name: KALIKA PATRAW MRN: [id number] Date of Birth: Mar 17, 1933  Subjective/Objective:               S/p ORIF right humerus     Action/Plan: Spoke with patient, husband and daughter about discharge plans, they would like to possibly hire an aide to assist the patient. Explained that insurance does not cover that service and gave daughter a list of agencies that provide aide services.  OT recommended 3N1, spoke with patient and daughter about different uses of 3N1, patient stated that she does not want a 3N1. No other discharge needs identified.  Expected Discharge Date:                  Expected Discharge Plan:  Home/Self Care  In-House Referral:  NA  Discharge planning Services  CM Consult  Post Acute Care Choice:  NA Choice offered to:  NA  DME Arranged:  N/A DME Agency:     HH Arranged:  NA HH Agency:     Status of Service:  Completed, signed off  Medicare Important Message Given:    Date Medicare IM Given:    Medicare IM give by:    Date Additional Medicare IM Given:    Additional Medicare Important Message give by:     If discussed at [place] of Stay Meetings, dates discussed:    Additional Comments:  Nila Nephew, RN 03/17/2016, 10:39 AM

## 2016-03-17 NOTE — Progress Notes (Signed)
Discharge papers given with medications with full understanding

## 2016-03-17 NOTE — Anesthesia Postprocedure Evaluation (Signed)
Anesthesia Post Note  Patient: [name]  Procedure(s) Performed: Procedure(s) (LRB): OPEN REDUCTION INTERNAL FIXATION (ORIF) RIGHT PROXIMAL HUMERUS FRACTURE (Right)  Patient location during evaluation: PACU Anesthesia Type: General Level of consciousness: awake and alert and patient cooperative Pain management: pain level controlled Vital Signs Assessment: post-procedure vital signs reviewed and stable Respiratory status: spontaneous breathing and respiratory function stable Cardiovascular status: stable Anesthetic complications: no    Last Vitals:  Filed Vitals:   03/17/16 0100 03/17/16 0500  BP: 116/48 112/46  Pulse: 90 79  Temp: 37.1 C 36.9 C  Resp: 18 18    Last Pain:  Filed Vitals:   03/17/16 0748  PainSc: [place]

## 2016-03-19 ENCOUNTER — Other Ambulatory Visit: Payer: Self-pay | Admitting: Oncology

## 2016-03-30 ENCOUNTER — Ambulatory Visit: Payer: Medicare Other | Admitting: Obstetrics and Gynecology

## 2016-04-06 ENCOUNTER — Ambulatory Visit: Payer: Medicare Other | Admitting: Obstetrics and Gynecology

## 2016-05-08 ENCOUNTER — Other Ambulatory Visit: Payer: Self-pay | Admitting: *Deleted

## 2016-05-08 ENCOUNTER — Telehealth: Payer: Self-pay | Admitting: *Deleted

## 2016-05-08 DIAGNOSIS — C50911 Malignant neoplasm of unspecified site of right female breast: Secondary | ICD-10-CM

## 2016-05-08 MED ORDER — LETROZOLE 2.5 MG PO TABS: 2.5000 mg | ORAL_TABLET | Freq: Every day | ORAL | Status: DC

## 2016-05-08 NOTE — Telephone Encounter (Signed)
requesting RF-90 days supply Letrozole 2.5 mg. RX renewed with [facility].  Pt has an apt with Dr. Rogue Bussing on 06/12/16.

## 2016-05-10 ENCOUNTER — Ambulatory Visit: Payer: Medicare Other | Admitting: Obstetrics and Gynecology

## 2016-05-31 ENCOUNTER — Ambulatory Visit (INDEPENDENT_AMBULATORY_CARE_PROVIDER_SITE_OTHER): Payer: Medicare Other | Admitting: Obstetrics and Gynecology

## 2016-05-31 ENCOUNTER — Encounter: Payer: Self-pay | Admitting: Obstetrics and Gynecology

## 2016-05-31 VITALS — BP 148/77 | HR 70 | Ht 64.0 in | Wt 160.2 lb

## 2016-05-31 DIAGNOSIS — N952 Postmenopausal atrophic vaginitis: Secondary | ICD-10-CM

## 2016-05-31 DIAGNOSIS — L439 Lichen planus, unspecified: Secondary | ICD-10-CM

## 2016-05-31 DIAGNOSIS — C50911 Malignant neoplasm of unspecified site of right female breast: Secondary | ICD-10-CM | POA: Diagnosis not present

## 2016-05-31 NOTE — Progress Notes (Signed)
Chief complaint: 1. Lichen planus 2. Vaginal atrophy  Patient presents for 6 month follow-up. She has noted an improvement in her symptomatology and actually has decreased using the Temovate ointment to approximately once a week. She reports no significant vaginal discharge. She reports no perineal itching, burning, or pain. Due to her breast cancer diagnosis, she has discontinued vaginal estrogen therapy. Patient recently was treated with steroids for arthritis flare and this may have had some positive impact on her lichen planus. Patient reports normal bowel and bladder function. She has not noted any change in abdominal girth, appetite, weight gain or weight loss.  Past Medical History:  Diagnosis Date  . Cancer of right breast ([place]) 02/16/15   Right breast, T1c, NO, MO; ER/PR pos. Her 2 neg  . Chronic cough   . Dysrhythmia   . Elevated lipase   . GERD (gastroesophageal reflux disease)   . Heart murmur   . Hyperlipidemia    Mixed  . Hypertension    Unspecified  . Inflammatory arthritis ([place])   . Lichen planus    oral and vulvar  . Osteoarthritis    hands, cervical spine and trigger nodules  . Peptic ulcer   . Reactive airway disease   . Seasonal allergies   . Vaginal atrophy   . Valvular disease   . Vasomotor instability    Past Surgical History:  Procedure Laterality Date  . AXILLARY LYMPH NODE BIOPSY Right 03/19/2015   Procedure: AXILLARY LYMPH NODE BIOPSY;  Surgeon: Christene Lye, MD;  Location: [facility] ORS;  Service: General;  Laterality: Right;  . BREAST BIOPSY Right 02/16/15  . BREAST EXCISIONAL BIOPSY Left ~ 2012   "non malignant"  . BREAST LUMPECTOMY Left 2012  . CARPAL TUNNEL RELEASE Right   . COLONOSCOPY    . COLONOSCOPY W/ POLYPECTOMY    . ESOPHAGOGASTRODUODENOSCOPY (EGD) WITH PROPOFOL N/A 07/28/2015   Procedure: ESOPHAGOGASTRODUODENOSCOPY (EGD) WITH PROPOFOL;  Surgeon: Manya Silvas, MD;  Location: [facility] END[facility]COPY;  Service: Endoscopy;  Laterality: N/A;   . MASTECTOMY    . ORIF HUMERUS FRACTURE Right 03/16/2016   Procedure: OPEN REDUCTION INTERNAL FIXATION (ORIF) RIGHT PROXIMAL HUMERUS FRACTURE;  Surgeon: Justice Britain, MD;  Location: [facility];  Service: Orthopedics;  Laterality: Right;  . ORIF PROXIMAL HUMERUS FRACTURE Right 03/16/2016  . SENTINEL NODE BIOPSY Right 03/19/2015   Procedure: SENTINEL NODE BIOPSY;  Surgeon: Christene Lye, MD;  Location: [facility] ORS;  Service: General;  Laterality: Right;  . SIMPLE MASTECTOMY WITH AXILLARY SENTINEL NODE BIOPSY Right 03/19/2015   Procedure: right total MASTECTOMY with sentinel node biopsy ;  Surgeon: Christene Lye, MD;  Location: [facility] ORS;  Service: General;  Laterality: Right;  Marland Kitchen VAGINOPLASTY      OBJECTIVE: BP (!) 148/77   Pulse 70   Ht 5\' 4"  (1.626 m)   Wt 160 lb 3 oz (72.7 kg)   BMI 27.50 kg/m  BLADDER: Normal PELVIC:                       External Genitalia: Atrophic changes with agglutination of the labia minora to the labia majora.  No leukoplakia, no vaginal discharge                       BUS: Normal; Urethral caruncle present                       Vagina: Atrophic; stenotic, admitting a pediatric speculum, (limited);  no discharge; admits first digit of exam finger only on exam without palpable masses                       Cervix: Not visible                       Uterus: Nonpalpable                       Adnexa: Normal; No masses or tenderness                       RV: External Exam Normal  ASSESSMENT: 1. Lichen planus, stable 2. Vaginal atrophy, stable  PLAN: 1. Recommend continued use of Temovate ointment 2-3 times weekly focusing on the introitus 2. Return in 6 months for follow-up 3. Defer pelvic ultrasound for assessment of ovaries until time when abdominal pelvic symptoms develop.  A total of 15 minutes were spent face-to-face with the patient during this encounter and over half of that time dealt with counseling and coordination of care.  Brayton Mars,  MD  Note: This dictation was prepared with Dragon dictation along with smaller phrase technology. Any transcriptional errors that result from this process are unintentional.

## 2016-05-31 NOTE — Patient Instructions (Signed)
1. Continue with Temovate ointment topically to once or twice a week 2. Return in 6 months for follow-up 3. If symptoms of bloating, abdominal pain, change in bowel function occur, notify office so ultrasound of pelvis may be obtained

## 2016-06-09 ENCOUNTER — Other Ambulatory Visit: Payer: Self-pay | Admitting: *Deleted

## 2016-06-09 DIAGNOSIS — Z853 Personal history of malignant neoplasm of breast: Secondary | ICD-10-CM

## 2016-06-12 ENCOUNTER — Inpatient Hospital Stay: Payer: Medicare Other | Attending: Internal Medicine | Admitting: Internal Medicine

## 2016-06-12 ENCOUNTER — Encounter (INDEPENDENT_AMBULATORY_CARE_PROVIDER_SITE_OTHER): Payer: Self-pay

## 2016-06-12 ENCOUNTER — Inpatient Hospital Stay: Payer: Medicare Other

## 2016-06-12 ENCOUNTER — Other Ambulatory Visit: Payer: Medicare Other

## 2016-06-12 ENCOUNTER — Ambulatory Visit: Payer: Medicare Other | Admitting: Oncology

## 2016-06-12 DIAGNOSIS — Z79811 Long term (current) use of aromatase inhibitors: Secondary | ICD-10-CM | POA: Diagnosis not present

## 2016-06-12 DIAGNOSIS — R748 Abnormal levels of other serum enzymes: Secondary | ICD-10-CM | POA: Diagnosis not present

## 2016-06-12 DIAGNOSIS — D0591 Unspecified type of carcinoma in situ of right breast: Secondary | ICD-10-CM

## 2016-06-12 DIAGNOSIS — Z170 Estrogen receptor positive status [ER+]: Secondary | ICD-10-CM

## 2016-06-12 DIAGNOSIS — K219 Gastro-esophageal reflux disease without esophagitis: Secondary | ICD-10-CM | POA: Diagnosis not present

## 2016-06-12 DIAGNOSIS — M064 Inflammatory polyarthropathy: Secondary | ICD-10-CM

## 2016-06-12 DIAGNOSIS — Z79899 Other long term (current) drug therapy: Secondary | ICD-10-CM | POA: Diagnosis not present

## 2016-06-12 DIAGNOSIS — C50211 Malignant neoplasm of upper-inner quadrant of right female breast: Secondary | ICD-10-CM | POA: Diagnosis not present

## 2016-06-12 DIAGNOSIS — E785 Hyperlipidemia, unspecified: Secondary | ICD-10-CM

## 2016-06-12 DIAGNOSIS — Z7982 Long term (current) use of aspirin: Secondary | ICD-10-CM | POA: Diagnosis not present

## 2016-06-12 DIAGNOSIS — Z853 Personal history of malignant neoplasm of breast: Secondary | ICD-10-CM

## 2016-06-12 DIAGNOSIS — Z9011 Acquired absence of right breast and nipple: Secondary | ICD-10-CM

## 2016-06-12 DIAGNOSIS — M791 Myalgia: Secondary | ICD-10-CM | POA: Diagnosis not present

## 2016-06-12 DIAGNOSIS — I10 Essential (primary) hypertension: Secondary | ICD-10-CM

## 2016-06-12 LAB — CBC WITH DIFFERENTIAL/PLATELET
Basophils Absolute: 0.1 10*3/uL (ref 0–0.1)
Basophils Relative: 1 %
EOS ABS: 0.2 10*3/uL (ref 0–0.7)
EOS PCT: 3 %
HCT: 42.5 % (ref 35.0–47.0)
Hemoglobin: 14.7 g/dL (ref 12.0–16.0)
LYMPHS ABS: 1.4 10*3/uL (ref 1.0–3.6)
LYMPHS PCT: 22 %
MCH: 30.7 pg (ref 26.0–34.0)
MCHC: 34.5 g/dL (ref 32.0–36.0)
MCV: 89 fL (ref 80.0–100.0)
MONO ABS: 0.6 10*3/uL (ref 0.2–0.9)
Monocytes Relative: 10 %
Neutro Abs: 4.2 10*3/uL (ref 1.4–6.5)
Neutrophils Relative %: 64 %
PLATELETS: 269 10*3/uL (ref 150–440)
RBC: 4.78 MIL/uL (ref 3.80–5.20)
RDW: 15.8 % — AB (ref 11.5–14.5)
WBC: 6.5 10*3/uL (ref 3.6–11.0)

## 2016-06-12 LAB — COMPREHENSIVE METABOLIC PANEL
ALT: 26 U/L (ref 14–54)
ANION GAP: 6 (ref 5–15)
AST: 29 U/L (ref 15–41)
Albumin: 4.3 g/dL (ref 3.5–5.0)
Alkaline Phosphatase: 151 U/L — ABNORMAL HIGH (ref 38–126)
BUN: 20 mg/dL (ref 6–20)
CHLORIDE: 108 mmol/L (ref 101–111)
CO2: 23 mmol/L (ref 22–32)
CREATININE: 0.75 mg/dL (ref 0.44–1.00)
Calcium: 9.1 mg/dL (ref 8.9–10.3)
Glucose, Bld: 133 mg/dL — ABNORMAL HIGH (ref 65–99)
POTASSIUM: 4.2 mmol/L (ref 3.5–5.1)
SODIUM: 137 mmol/L (ref 135–145)
Total Bilirubin: 1 mg/dL (ref 0.3–1.2)
Total Protein: 7.2 g/dL (ref 6.5–8.1)

## 2016-06-12 NOTE — Progress Notes (Signed)
Patient broke her right humerus in May and had surgery one week later.  She is now doing well.

## 2016-06-12 NOTE — Assessment & Plan Note (Signed)
#  Stage I ER/PR- pos; her 2 neu- NEG. S/p mastec; Low risk- mammaprint; cont Letrozole; NED. Mamm- march 2017-NEG  # Athrtis- sec to letrzole; try chondroitin/glucosamine.  # Elevated Alk Phos- 151- repeat in 6 months.   # follow up in 6 months/labs.

## 2016-06-12 NOTE — Progress Notes (Signed)
[place] OFFICE PROGRESS NOTE  Patient Care Team: Idelle Crouch, MD as PCP - General (Internal Medicine) Idelle Crouch, MD (Internal Medicine) Seeplaputhur Robinette Haines, MD (General Surgery)  Cancer of right breast [facility])   Staging form: Breast, AJCC 7th Edition   - Clinical: Stage IA (T1c, N0, M0) - Signed by [place] Gleason, MD on 05/03/2015   Oncology History   v1.  Carcinoma of right breast 2 different reasons. 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   3.  Status post right breast mastectomy With tumor size of T1 cN0 M0.  Estrogen receptor positive.  Progesterone receptor positive.  Sentinel lymph nodes were negative.  (May, 2016) mammo print  Low risk  Patient has been started on letrozole from April 29, 2015     Cancer of right breast [facility])   04/03/2015 Initial Diagnosis    Cancer of right breast        INTERVAL HISTORY:  [name] 82 y.o.  female pleasant patient above history of Breast cancer-stage I is here for follow-up. She continues to be on letrozole.  She complains of chronic aches and pains in her joints. Denies any unusual lumps or bumps. Denies any nausea vomiting. Denies any headaches. Denies any bone pain.   REVIEW OF SYSTEMS:  A complete 10 point review of system is done which is negative except mentioned above/history of present illness.   PAST MEDICAL HISTORY :  Past Medical History:  Diagnosis Date  . Cancer of right breast ([place]) 02/16/15   Right breast, T1c, NO, [place]; ER/PR pos. Her 2 neg  . Chronic cough   . Dysrhythmia   . Elevated lipase   . GERD (gastroesophageal reflux disease)   . Heart murmur   . Hyperlipidemia    Mixed  . Hypertension    Unspecified  . Inflammatory  arthritis ([place])   . Lichen planus    oral and vulvar  . Osteoarthritis    hands, cervical spine and trigger nodules  . Peptic ulcer   . Reactive airway disease   . Seasonal allergies   . Vaginal atrophy   . Valvular disease   . Vasomotor instability     PAST SURGICAL HISTORY :   Past Surgical History:  Procedure Laterality Date  . AXILLARY LYMPH NODE BIOPSY Right 03/19/2015   Procedure: AXILLARY LYMPH NODE BIOPSY;  Surgeon: Christene Lye, MD;  Location: [facility] ORS;  Service: General;  Laterality: Right;  . BREAST BIOPSY Right 02/16/15  . BREAST EXCISIONAL BIOPSY Left ~ 2012   "non malignant"  . BREAST LUMPECTOMY Left 2012  . CARPAL TUNNEL RELEASE Right   . COLONOSCOPY    . COLONOSCOPY W/ POLYPECTOMY    . ESOPHAGOGASTRODUODENOSCOPY (EGD) WITH PROPOFOL N/A 07/28/2015   Procedure: ESOPHAGOGASTRODUODENOSCOPY (EGD) WITH PROPOFOL;  Surgeon: Manya Silvas, MD;  Location: [facility] ENDOSCOPY;  Service: Endoscopy;  Laterality: N/A;  . MASTECTOMY    . ORIF HUMERUS FRACTURE Right 03/16/2016   Procedure: OPEN REDUCTION INTERNAL FIXATION (ORIF) RIGHT PROXIMAL HUMERUS FRACTURE;  Surgeon: Justice Britain, MD;  Location: [facility];  Service: Orthopedics;  Laterality: Right;  . ORIF PROXIMAL HUMERUS FRACTURE Right 03/16/2016  . SENTINEL NODE BIOPSY Right 03/19/2015   Procedure: SENTINEL NODE BIOPSY;  Surgeon: Christene Lye, MD;  Location: [facility]  ORS;  Service: General;  Laterality: Right;  . SIMPLE MASTECTOMY WITH AXILLARY SENTINEL NODE BIOPSY Right 03/19/2015   Procedure: right total MASTECTOMY with sentinel node biopsy ;  Surgeon: Christene Lye, MD;  Location: [facility] ORS;  Service: General;  Laterality: Right;  Marland Kitchen VAGINOPLASTY      FAMILY HISTORY :   Family History  Problem Relation Age of Onset  . Colon cancer Paternal Grandfather 25  . Colon cancer Paternal Uncle 70  . Heart disease Father   . Heart disease Mother   . Diabetes Mother   . Diabetes Daughter     SOCIAL HISTORY:    Social History  Substance Use Topics  . Smoking status: Former Smoker    Quit date: 11/06/1966  . Smokeless tobacco: Never Used  . Alcohol use 4.2 oz/week    7 Glasses of wine per week     Comment: wine at night    ALLERGIES:  has No Known Allergies.  MEDICATIONS:  Current Outpatient Prescriptions  Medication Sig Dispense Refill  . aspirin 81 MG EC tablet Take 81 mg by mouth daily.      Marland Kitchen atorvastatin (LIPITOR) 20 MG tablet Take 20 mg by mouth at bedtime.     . Calcium Carbonate-Vitamin D (CALCIUM-VITAMIN D3) 600-200 MG-UNIT TABS Take 1 tablet by mouth 2 (two) times daily.      . clobetasol ointment (TEMOVATE) 0.05 % Apply topically.    Marland Kitchen letrozole (FEMARA) 2.5 MG tablet Take 1 tablet (2.5 mg total) by mouth daily. 30 tablet 2  . metoprolol succinate (TOPROL-XL) 25 MG 24 hr tablet     . Multiple Vitamins-Minerals (CENTRUM) tablet Take 1 tablet by mouth daily.      . nebivolol (BYSTOLIC) 10 MG tablet TAKE 1 TABLET ONCE DAILY   PLEASE MAKE AN APPOINTMENT WITH YOUR DOCTOR    . pantoprazole (PROTONIX) 40 MG tablet Take 40 mg by mouth 2 (two) times daily.      Marland Kitchen PATADAY 0.2 % SOLN Place 1 drop into both eyes daily as needed (allergies).      No current facility-administered medications for this visit.     PHYSICAL EXAMINATION: ECOG PERFORMANCE STATUS: 1 - Symptomatic but completely ambulatory  BP (!) 147/82 (BP Location: Left Arm, Patient Position: Sitting)   Pulse 67   Temp 98.8 F (37.1 C) (Tympanic)   Resp 18   Wt 160 lb 6 oz (72.7 kg)   BMI 27.53 kg/m   Filed Weights   06/12/16 0955  Weight: 160 lb 6 oz (72.7 kg)    GENERAL: Well-nourished well-developed; Alert, no distress and comfortable.  Alone. EYES: no pallor or icterus OROPHARYNX: no thrush or ulceration; good dentition  NECK: supple, no masses felt LYMPH:  no palpable lymphadenopathy in the cervical, axillary or inguinal regions LUNGS: clear to auscultation and  No wheeze or crackles HEART/CVS: regular rate &  rhythm and no murmurs; No lower extremity edema ABDOMEN:abdomen soft, non-tender and normal bowel sounds Musculoskeletal:no cyanosis of digits and no clubbing  PSYCH: alert & oriented x 3 with fluent speech NEURO: no focal motor/sensory deficits SKIN:  no rashes or significant lesions Right and left BREAST exam [in the presence of nurse]- no unusual skin changes or dominant masses felt. Surgical scars noted.    LABORATORY DATA:  I have reviewed the data as listed    Component Value Date/Time   NA 137 06/12/2016 0850   K 4.2 06/12/2016 0850   CL 108 06/12/2016 0850   CO2 23  06/12/2016 0850   GLUCOSE 133 (H) 06/12/2016 0850   BUN 20 06/12/2016 0850   CREATININE 0.75 06/12/2016 0850   CALCIUM 9.1 06/12/2016 0850   PROT 7.2 06/12/2016 0850   ALBUMIN 4.3 06/12/2016 0850   AST 29 06/12/2016 0850   ALT 26 06/12/2016 0850   ALKPHOS 151 (H) 06/12/2016 0850   BILITOT 1.0 06/12/2016 0850   GFRNONAA >60 06/12/2016 0850   GFRAA >60 06/12/2016 0850    No results found for: SPEP, UPEP  Lab Results  Component Value Date   WBC 6.5 06/12/2016   NEUTROABS 4.2 06/12/2016   HGB 14.7 06/12/2016   HCT 42.5 06/12/2016   MCV 89.0 06/12/2016   PLT 269 06/12/2016      Chemistry      Component Value Date/Time   NA 137 06/12/2016 0850   K 4.2 06/12/2016 0850   CL 108 06/12/2016 0850   CO2 23 06/12/2016 0850   BUN 20 06/12/2016 0850   CREATININE 0.75 06/12/2016 0850      Component Value Date/Time   CALCIUM 9.1 06/12/2016 0850   ALKPHOS 151 (H) 06/12/2016 0850   AST 29 06/12/2016 0850   ALT 26 06/12/2016 0850   BILITOT 1.0 06/12/2016 0850       RADIOGRAPHIC STUDIES: I have personally reviewed the radiological images as listed and agreed with the findings in the report. No results found.   ASSESSMENT & PLAN:  Primary cancer of upper inner quadrant of right breast ([place]) # Stage I ER/PR- pos; her 2 neu- NEG. S/p mastec; Low risk- mammaprint; cont Letrozole; NED. Mamm- march  2017-NEG  # Athrtis- sec to letrzole; try chondroitin/glucosamine.  # Elevated Alk Phos- 151- repeat in 6 months.   # follow up in 6 months/labs.    Orders Placed This Encounter  Procedures  . Comprehensive metabolic panel    Standing Status:   Future    Standing Expiration Date:   12/13/2017  . CBC with Differential    Standing Status:   Future    Standing Expiration Date:   12/13/2017   All questions were answered. The patient knows to call the clinic with any problems, questions or concerns.      Cammie Sickle, MD 06/12/2016 1:26 PM

## 2016-06-13 ENCOUNTER — Ambulatory Visit: Payer: Medicare Other | Attending: Internal Medicine | Admitting: Occupational Therapy

## 2016-06-13 DIAGNOSIS — M79641 Pain in right hand: Secondary | ICD-10-CM | POA: Diagnosis present

## 2016-06-13 DIAGNOSIS — M25641 Stiffness of right hand, not elsewhere classified: Secondary | ICD-10-CM | POA: Insufficient documentation

## 2016-06-13 DIAGNOSIS — M25642 Stiffness of left hand, not elsewhere classified: Secondary | ICD-10-CM | POA: Diagnosis present

## 2016-06-13 DIAGNOSIS — M6281 Muscle weakness (generalized): Secondary | ICD-10-CM | POA: Insufficient documentation

## 2016-06-13 DIAGNOSIS — M79642 Pain in left hand: Secondary | ICD-10-CM | POA: Insufficient documentation

## 2016-06-13 NOTE — Patient Instructions (Signed)
Contrast at home  Info provided for joint protection and AE - reviewed  And hand outs   AROM for tendon glides - full fist to 4 cm foam block R, 2 cm in L  Oppostion to all digits R and L hand

## 2016-06-13 NOTE — Therapy (Signed)
[facility] [facility] [address], [place], [address] Phone: [phone]   Fax:  [phone]  Occupational Therapy Treatment  Patient Details  Name: [name] MRN: [id number] Date of Birth: 1933-03-16 Referring Provider: Meda Coffee  Encounter Date: 06/13/2016      OT End of Session - 06/13/16 1650    Visit Number 1   Number of Visits 4   Date for OT Re-Evaluation 07/11/16   OT Start Time 1301   OT Stop Time 1355   OT Time Calculation (min) 54 min   Activity Tolerance Patient tolerated treatment well   Behavior During Therapy [facility] for tasks assessed/performed      Past Medical History:  Diagnosis Date  . Cancer of right breast ([place]) 02/16/15   Right breast, T1c, NO, [place]; ER/PR pos. Her 2 neg  . Chronic cough   . Dysrhythmia   . Elevated lipase   . GERD (gastroesophageal reflux disease)   . Heart murmur   . Hyperlipidemia    Mixed  . Hypertension    Unspecified  . Inflammatory arthritis ([place])   . Lichen planus    oral and vulvar  . Osteoarthritis    hands, cervical spine and trigger nodules  . Peptic ulcer   . Reactive airway disease   . Seasonal allergies   . Vaginal atrophy   . Valvular disease   . Vasomotor instability     Past Surgical History:  Procedure Laterality Date  . AXILLARY LYMPH NODE BIOPSY Right 03/19/2015   Procedure: AXILLARY LYMPH NODE BIOPSY;  Surgeon: Christene Lye, MD;  Location: [facility] ORS;  Service: General;  Laterality: Right;  . BREAST BIOPSY Right 02/16/15  . BREAST EXCISIONAL BIOPSY Left ~ 2012   "non malignant"  . BREAST LUMPECTOMY Left 2012  . CARPAL TUNNEL RELEASE Right   . COLONOSCOPY    . COLONOSCOPY W/ POLYPECTOMY    . ESOPHAGOGASTRODUODENOSCOPY (EGD) WITH PROPOFOL N/A 07/28/2015   Procedure: ESOPHAGOGASTRODUODENOSCOPY (EGD) WITH PROPOFOL;  Surgeon: Manya Silvas, MD;  Location: [facility] ENDOSCOPY;  Service: Endoscopy;  Laterality: N/A;  . MASTECTOMY    . ORIF  HUMERUS FRACTURE Right 03/16/2016   Procedure: OPEN REDUCTION INTERNAL FIXATION (ORIF) RIGHT PROXIMAL HUMERUS FRACTURE;  Surgeon: Justice Britain, MD;  Location: [facility];  Service: Orthopedics;  Laterality: Right;  . ORIF PROXIMAL HUMERUS FRACTURE Right 03/16/2016  . SENTINEL NODE BIOPSY Right 03/19/2015   Procedure: SENTINEL NODE BIOPSY;  Surgeon: Christene Lye, MD;  Location: [facility] ORS;  Service: General;  Laterality: Right;  . SIMPLE MASTECTOMY WITH AXILLARY SENTINEL NODE BIOPSY Right 03/19/2015   Procedure: right total MASTECTOMY with sentinel node biopsy ;  Surgeon: Christene Lye, MD;  Location: [facility] ORS;  Service: General;  Laterality: Right;  Marland Kitchen VAGINOPLASTY      There were no vitals filed for this visit.      Subjective Assessment - 06/13/16 1308    Subjective  I had symptoms for few years - and then fell and fracture R humerus and since then my R hand got worse - was on predisone   last month but did not really help - cannot make fist - middle finger really hurts - hard time turning car key, writing , opening jars, cooking and chopping    Patient Stated Goals Want the pain better , and how to use my hands better    Currently in Pain? Yes   Pain Score 6    Pain Location  Hand   Pain Orientation Right;Left   Pain Descriptors / Indicators Aching   Pain Type Chronic pain   Pain Frequency Constant            [facility] OT Assessment - 06/13/16 0001      Assessment   Diagnosis OA with R hand pain more than L    Referring Provider Meda Coffee   Onset Date 03/06/16     Balance Screen   Has the patient fallen in the past 6 months Yes   How many times? 1   Has the patient had a decrease in activity level because of a fear of falling?  No   Is the patient reluctant to leave their home because of a fear of falling?  No     Home  Environment   Lives With Spouse     Prior Function   Vocation Retired   Leisure [employer], church , book club , garden, R hand dominant -own house work  , Regulatory affairs officer (lbs) 7   Right Hand Lateral Pinch 12 lbs   Right Hand 3 Point Pinch 6 lbs   Left Hand Grip (lbs) 20   Left Hand Lateral Pinch 10 lbs   Left Hand 3 Point Pinch 10 lbs     Right Hand AROM   R Thumb Opposition to Index --  cannot do oppostion pad on pad 3rd thru 5th    R Index  MCP 0-90 65 Degrees   R Index PIP 0-100 80 Degrees   R Pieri  MCP 0-90 65 Degrees   R [place] PIP 0-100 40 Degrees   R Ring  MCP 0-90 65 Degrees   R Ring PIP 0-100 65 Degrees   R Little  MCP 0-90 75 Degrees   R Little PIP 0-100 48 Degrees     Left Hand AROM   L Index  MCP 0-90 65 Degrees   L Index PIP 0-100 95 Degrees   L [place]  MCP 0-90 65 Degrees   L [place] PIP 0-100 75 Degrees   L Ring  MCP 0-90 65 Degrees   L Ring PIP 0-100 80 Degrees   L Little  MCP 0-90 80 Degrees   L Little PIP 0-100 70 Degrees      Parafin done - reviewed HEP - see pt instruction                    OT Education - 06/13/16 1650    Education provided Yes   Education Details HEP and joint protection principles    Person(s) Educated Patient   Methods Explanation;Demonstration;Verbal cues;Tactile cues;Handout   Comprehension Verbal cues required;Returned demonstration;Verbalized understanding          OT Short Term Goals - 06/13/16 1705      OT SHORT TERM GOAL #1   Title Pain on PRWHE improve by at least 10 points    Baseline 24/50 for pain on PRWHE   Time 3   Period Weeks   Status New           OT [place] Term Goals - 06/13/16 1706      OT [place] TERM GOAL #1   Title Pt to be independent in HEP to increase/maintain  R and L hand AROM in digiits to increase function on PRWHE   Baseline See flowsheet for ROM - PRWHE for function 27.5/50 at eval   Time 4   Period Weeks  Status New     OT [place] TERM GOAL #2   Title Pt to verbalize understanding for 3  joint protection and AE  to use to increase ease of performing ADL's and IADL's    Baseline 4   Period Weeks    Status New     OT [place] TERM GOAL #3   Title Grip strength in R and L hand improve with 3-5 lbs    Baseline Grip 7 on R, 18 on L    Time 4   Period Weeks   Status New               Plan - 06/13/16 1651    Clinical Impression Statement Pt refer to OT for increase hand pain and stiffness in R hand more than L - pt do report it got worse after she fractured her R humerus in May - pt show pain in R hand more than L ,  severe impairment in ROM  in R digits more than L , decrease grip and  3 point grip - R worse than L,  limiting her functional use of R  dominant hand in ADL's and IADL's    Rehab Potential Fair   OT Frequency 1x / week   OT Duration 4 weeks   OT Treatment/Interventions Self-care/ADL training;Ultrasound;Parrafin;Therapeutic exercise;Contrast Bath;Patient/family education;Therapeutic exercises;Passive range of motion;Manual Therapy   Plan Assess progress, pt to bring list of what she has pain and trouble doing - review HEP and joint protection   OT Home Exercise Plan See pt instruction    Consulted and Agree with Plan of Care Patient      Patient will benefit from skilled therapeutic intervention in order to improve the following deficits and impairments:  Impaired flexibility, Decreased range of motion, Decreased coordination, Decreased knowledge of use of DME, Pain, Impaired UE functional use, Decreased strength  Visit Diagnosis: Pain in left hand - Plan: Ot plan of care cert/re-cert  Pain in right hand - Plan: Ot plan of care cert/re-cert  Stiffness of right hand, not elsewhere classified - Plan: Ot plan of care cert/re-cert  Stiffness of left hand, not elsewhere classified - Plan: Ot plan of care cert/re-cert  Muscle weakness (generalized) - Plan: Ot plan of care cert/re-cert    Problem List Patient Active Problem List   Diagnosis Date Noted  . Primary cancer of upper inner quadrant of right breast ([place]) 06/12/2016  . S/P ORIF (open reduction internal  fixation) fracture 03/16/2016  . Awareness of heartbeats 04/03/2015  . Gastroduodenal ulcer 04/03/2015  . Fast heart beat 04/03/2015  . Allergy to environmental factors 04/03/2015  . Cancer of right breast ([place]) 04/03/2015  . Chest pain 01/01/2015  . HYPERLIPIDEMIA-MIXED 04/14/2009  . HYPERTENSION, UNSPECIFIED 04/14/2009    Rosalyn Gess OTR/L, CLT  06/13/2016, 5:20 PM  [place] [facility] [address], [place], [address] Phone: [phone]   Fax:  [phone]  Name: SHERYLANN PRIEUR MRN: [id number] Date of Birth: 1933/09/07

## 2016-06-19 ENCOUNTER — Ambulatory Visit: Payer: Medicare Other | Admitting: Occupational Therapy

## 2016-06-19 DIAGNOSIS — M79642 Pain in left hand: Secondary | ICD-10-CM

## 2016-06-19 DIAGNOSIS — M6281 Muscle weakness (generalized): Secondary | ICD-10-CM

## 2016-06-19 DIAGNOSIS — M25642 Stiffness of left hand, not elsewhere classified: Secondary | ICD-10-CM

## 2016-06-19 DIAGNOSIS — M25641 Stiffness of right hand, not elsewhere classified: Secondary | ICD-10-CM

## 2016-06-19 DIAGNOSIS — M79641 Pain in right hand: Secondary | ICD-10-CM

## 2016-06-19 NOTE — Therapy (Signed)
[facility] [address], [place], [address] Phone: [phone]   Fax:  [phone]  Occupational Therapy Treatment  Patient Details  Name: [name] MRN: [id number] Date of Birth: 1933-08-04 Referring Provider: Meda Coffee  Encounter Date: 06/19/2016      OT End of Session - 06/19/16 0917    Visit Number 2   Number of Visits 4   Date for OT Re-Evaluation 07/11/16   OT Start Time 0853   OT Stop Time 0948   OT Time Calculation (min) 55 min   Activity Tolerance Patient tolerated treatment well   Behavior During Therapy [facility] for tasks assessed/performed      Past Medical History:  Diagnosis Date  . Cancer of right breast ([place]) 02/16/15   Right breast, T1c, NO, [place]; ER/PR pos. Her 2 neg  . Chronic cough   . Dysrhythmia   . Elevated lipase   . GERD (gastroesophageal reflux disease)   . Heart murmur   . Hyperlipidemia    Mixed  . Hypertension    Unspecified  . Inflammatory arthritis ([place])   . Lichen planus    oral and vulvar  . Osteoarthritis    hands, cervical spine and trigger nodules  . Peptic ulcer   . Reactive airway disease   . Seasonal allergies   . Vaginal atrophy   . Valvular disease   . Vasomotor instability     Past Surgical History:  Procedure Laterality Date  . AXILLARY LYMPH NODE BIOPSY Right 03/19/2015   Procedure: AXILLARY LYMPH NODE BIOPSY;  Surgeon: Christene Lye, MD;  Location: [facility] ORS;  Service: General;  Laterality: Right;  . BREAST BIOPSY Right 02/16/15  . BREAST EXCISIONAL BIOPSY Left ~ 2012   "non malignant"  . BREAST LUMPECTOMY Left 2012  . CARPAL TUNNEL RELEASE Right   . COLONOSCOPY    . COLONOSCOPY W/ POLYPECTOMY    . ESOPHAGOGASTRODUODENOSCOPY (EGD) WITH PROPOFOL N/A 07/28/2015   Procedure: ESOPHAGOGASTRODUODENOSCOPY (EGD) WITH PROPOFOL;  Surgeon: Manya Silvas, MD;  Location: [facility] ENDOSCOPY;  Service: Endoscopy;  Laterality: N/A;  . MASTECTOMY    . ORIF  HUMERUS FRACTURE Right 03/16/2016   Procedure: OPEN REDUCTION INTERNAL FIXATION (ORIF) RIGHT PROXIMAL HUMERUS FRACTURE;  Surgeon: Justice Britain, MD;  Location: [place];  Service: Orthopedics;  Laterality: Right;  . ORIF PROXIMAL HUMERUS FRACTURE Right 03/16/2016  . SENTINEL NODE BIOPSY Right 03/19/2015   Procedure: SENTINEL NODE BIOPSY;  Surgeon: Christene Lye, MD;  Location: [facility] ORS;  Service: General;  Laterality: Right;  . SIMPLE MASTECTOMY WITH AXILLARY SENTINEL NODE BIOPSY Right 03/19/2015   Procedure: right total MASTECTOMY with sentinel node biopsy ;  Surgeon: Christene Lye, MD;  Location: [facility] ORS;  Service: General;  Laterality: Right;  Marland Kitchen VAGINOPLASTY      There were no vitals filed for this visit.      Subjective Assessment - 06/19/16 0857    Subjective  My hands is hurting - this rainy weather - I worked it but I don't know if you are going to be happy with my progress    Currently in Pain? Yes   Pain Score 5    Pain Location Hand   Pain Orientation Right;Left   Pain Descriptors / Indicators Aching   Pain Type Chronic pain            [facility] OT Assessment - 06/19/16 0001      Right Hand AROM   R Index  MCP 0-90 80 Degrees   R Index PIP 0-100 80 Degrees   R [place]  MCP 0-90 70 Degrees   R Kercher PIP 0-100 35 Degrees   R Ring  MCP 0-90 80 Degrees   R Ring PIP 0-100 80 Degrees   R Little  MCP 0-90 85 Degrees   R Little PIP 0-100 60 Degrees     Left Hand AROM   L Index  MCP 0-90 70 Degrees   L Index PIP 0-100 100 Degrees   L Varnell  MCP 0-90 65 Degrees   L Mole PIP 0-100 90 Degrees   L Ring  MCP 0-90 70 Degrees   L Ring PIP 0-100 96 Degrees   L Little  MCP 0-90 80 Degrees   L Little PIP 0-100 80 Degrees                  OT Treatments/Exercises (OP) - 06/19/16 0001      RUE Paraffin   Number Minutes Paraffin 10 Minutes   RUE Paraffin Location Hand   Comments At [facility] to increase ROM and decrease pain      LUE Paraffin   Number Minutes  Paraffin 10 Minutes   LUE Paraffin Location Hand   Comments at [facility] Increase ROM and decrease pain       Soft tissue mobs to lateral bands of PIP's  - joint mobs to MC's and gentle traction on digits no pain   After parafin pain decrease to 2/10 in R - 1/10 in L hand   Tendon glides   AROM but need to block MC's flexion proximal to [facility]  And PIP's at [facility] - needed Mod A , Min V/c  Full fist to 4 cm foam block on R hand and 2 cm foam block for L hand Fitted with buddy strap to R 3rd to use with 2nd and 4th - to increase PIP flexion during gripping activities - to decrease deviation of 2nd and 4th   Opposition to each digits- pad on pad possible after paraffin on L , but R hand still unable for 3rd thru 5th  Cont with joint protection - reviewed again with pt              OT Education - 06/19/16 0916    Education provided Yes   Education Details HEP   Person(s) Educated Patient   Methods Explanation;Demonstration;Tactile cues;Verbal cues   Comprehension Verbal cues required;Returned demonstration;Verbalized understanding          OT Short Term Goals - 06/13/16 1705      OT SHORT TERM GOAL #1   Title Pain on PRWHE improve by at least 10 points    Baseline 24/50 for pain on PRWHE   Time 3   Period Weeks   Status New           OT [place] Term Goals - 06/13/16 1706      OT [place]rank TERM GOAL #1   Title Pt to be independent in HEP to increase/maintain  R and L hand AROM in digiits to increase function on PRWHE   Baseline See flowsheet for ROM - PRWHE for function 27.5/50 at eval   Time 4   Period Weeks   Status New     OT [place] TERM GOAL #2   Title Pt to verbalize understanding for 3  joint protection and AE  to use to increase ease of performing ADL's and IADL's    Baseline 4  Period Weeks   Status New     OT [place] TERM GOAL #3   Title Grip strength in R and L hand improve with 3-5 lbs    Baseline Grip 7 on R, 18 on L    Time 4   Period Weeks   Status New                Plan - 06/19/16 0917    Clinical Impression Statement Pt made progress in PIP and MC's flexion in all digits except R 3rd PIP - but pain still increase - pt  needed mod A with HEP and to work on jointprotection this week and ROM - but not to increase pain - stop prior to increase pain     Rehab Potential Fair   OT Frequency 1x / week   OT Duration 4 weeks   OT Treatment/Interventions Self-care/ADL training;Ultrasound;Parrafin;Therapeutic exercise;Contrast Bath;Patient/family education;Therapeutic exercises;Passive range of motion;Manual Therapy   Plan assess progress , and list of ADL's that she has trouble doing or has pain with    OT Home Exercise Plan See pt instruction    Consulted and Agree with Plan of Care Patient      Patient will benefit from skilled therapeutic intervention in order to improve the following deficits and impairments:  Impaired flexibility, Decreased range of motion, Decreased coordination, Decreased knowledge of use of DME, Pain, Impaired UE functional use, Decreased strength  Visit Diagnosis: Pain in left hand  Pain in right hand  Stiffness of right hand, not elsewhere classified  Stiffness of left hand, not elsewhere classified  Muscle weakness (generalized)    Problem List Patient Active Problem List   Diagnosis Date Noted  . Primary cancer of upper inner quadrant of right breast ([place]) 06/12/2016  . S/P ORIF (open reduction internal fixation) fracture 03/16/2016  . Awareness of heartbeats 04/03/2015  . Gastroduodenal ulcer 04/03/2015  . Fast heart beat 04/03/2015  . Allergy to environmental factors 04/03/2015  . Cancer of right breast ([place]) 04/03/2015  . Chest pain 01/01/2015  . HYPERLIPIDEMIA-MIXED 04/14/2009  . HYPERTENSION, UNSPECIFIED 04/14/2009    Rosalyn Gess OTR/L,CLT  06/19/2016, 9:53 AM  [facility] [address], [place],  [address] Phone: [phone]   Fax:  [phone]  Name: ESTA POMAVILLE MRN: [id number] Date of Birth: June 07, 1933

## 2016-06-19 NOTE — Patient Instructions (Signed)
Cont with contrast  And    Tendon glides   AROM but need to block MC's flexion proximal to [facility]  And PIP's at [facility] - needed Mod A , Min V/c  Full fist to 4 cm foam block on R hand and 2 cm foam block for L hand Fitted with buddy strap to R 3rd to use with 2nd and 4th - to increase PIP flexion during gripping activities - to decrease deviation of 2nd and 4th     Opposition to each digits- pad on pad Cont with joint protection - reviewed again with pt

## 2016-06-27 ENCOUNTER — Ambulatory Visit: Payer: Medicare Other | Admitting: Occupational Therapy

## 2016-06-27 DIAGNOSIS — M25641 Stiffness of right hand, not elsewhere classified: Secondary | ICD-10-CM

## 2016-06-27 DIAGNOSIS — M6281 Muscle weakness (generalized): Secondary | ICD-10-CM

## 2016-06-27 DIAGNOSIS — M79641 Pain in right hand: Secondary | ICD-10-CM

## 2016-06-27 DIAGNOSIS — M25642 Stiffness of left hand, not elsewhere classified: Secondary | ICD-10-CM

## 2016-06-27 DIAGNOSIS — M79642 Pain in left hand: Secondary | ICD-10-CM

## 2016-06-27 NOTE — Therapy (Signed)
[facility] [address] [place], [address] Phone: [phone]   Fax:  [phone]  Occupational Therapy Treatment  Patient Details  Name: [name] MRN: [id number] Date of Birth: 12-Mar-1933 Referring Provider: Meda Coffee  Encounter Date: 06/27/2016      OT End of Session - 06/27/16 0935    Visit Number 3   Number of Visits 4   Date for OT Re-Evaluation 07/11/16   OT Start Time 0907   OT Stop Time 0955   OT Time Calculation (min) 48 min   Activity Tolerance Patient tolerated treatment well   Behavior During Therapy [facility] for tasks assessed/performed      Past Medical History:  Diagnosis Date  . Cancer of right breast ([place]) 02/16/15   Right breast, T1c, NO, [place]; ER/PR pos. Her 2 neg  . Chronic cough   . Dysrhythmia   . Elevated lipase   . GERD (gastroesophageal reflux disease)   . Heart murmur   . Hyperlipidemia    Mixed  . Hypertension    Unspecified  . Inflammatory arthritis ([place])   . Lichen planus    oral and vulvar  . Osteoarthritis    hands, cervical spine and trigger nodules  . Peptic ulcer   . Reactive airway disease   . Seasonal allergies   . Vaginal atrophy   . Valvular disease   . Vasomotor instability     Past Surgical History:  Procedure Laterality Date  . AXILLARY LYMPH NODE BIOPSY Right 03/19/2015   Procedure: AXILLARY LYMPH NODE BIOPSY;  Surgeon: Christene Lye, MD;  Location: [facility] ORS;  Service: General;  Laterality: Right;  . BREAST BIOPSY Right 02/16/15  . BREAST EXCISIONAL BIOPSY Left ~ 2012   "non malignant"  . BREAST LUMPECTOMY Left 2012  . CARPAL TUNNEL RELEASE Right   . COLONOSCOPY    . COLONOSCOPY W/ POLYPECTOMY    . ESOPHAGOGASTRODUODENOSCOPY (EGD) WITH PROPOFOL N/A 07/28/2015   Procedure: ESOPHAGOGASTRODUODENOSCOPY (EGD) WITH PROPOFOL;  Surgeon: Manya Silvas, MD;  Location: [facility]COPY;  Service: Endoscopy;  Laterality: N/A;  . MASTECTOMY    . ORIF  HUMERUS FRACTURE Right 03/16/2016   Procedure: OPEN REDUCTION INTERNAL FIXATION (ORIF) RIGHT PROXIMAL HUMERUS FRACTURE;  Surgeon: Justice Britain, MD;  Location: [place];  Service: Orthopedics;  Laterality: Right;  . ORIF PROXIMAL HUMERUS FRACTURE Right 03/16/2016  . SENTINEL NODE BIOPSY Right 03/19/2015   Procedure: SENTINEL NODE BIOPSY;  Surgeon: Christene Lye, MD;  Location: [facility] ORS;  Service: General;  Laterality: Right;  . SIMPLE MASTECTOMY WITH AXILLARY SENTINEL NODE BIOPSY Right 03/19/2015   Procedure: right total MASTECTOMY with sentinel node biopsy ;  Surgeon: Christene Lye, MD;  Location: [facility] ORS;  Service: General;  Laterality: Right;  Marland Kitchen VAGINOPLASTY      There were no vitals filed for this visit.      Subjective Assessment - 06/27/16 0910    Subjective  About the same - pain and ROM - I can use it more - like cutting veggies - and compensating - using palm , enlarging grips    Patient Stated Goals Want the pain better , and how to use my hands better    Currently in Pain? Yes   Pain Score 4    Pain Location Hand   Pain Orientation Right;Left   Pain Descriptors / Indicators Aching            [facility] OT Assessment - 06/27/16 0001  Strength   Right Hand Grip (lbs) 11   Right Hand Lateral Pinch 12 lbs   Right Hand 3 Point Pinch 7 lbs   Left Hand Grip (lbs) 22   Left Hand Lateral Pinch 10 lbs   Left Hand 3 Point Pinch 10 lbs     Right Hand AROM   R Index  MCP 0-90 75 Degrees   R Index PIP 0-100 90 Degrees   R [place]  MCP 0-90 70 Degrees   R Cartwright PIP 0-100 45 Degrees   R Ring  MCP 0-90 80 Degrees   R Ring PIP 0-100 85 Degrees   R Little  MCP 0-90 85 Degrees   R Little PIP 0-100 70 Degrees     Left Hand AROM   L Index  MCP 0-90 65 Degrees   L Index PIP 0-100 100 Degrees   L [place]  MCP 0-90 70 Degrees   L Manley PIP 0-100 85 Degrees   L Ring  MCP 0-90 70 Degrees   L Ring PIP 0-100 100 Degrees   L Little  MCP 0-90 75 Degrees   L Little PIP 0-100 70  Degrees                  OT Treatments/Exercises (OP) - 06/27/16 0001      RUE Paraffin   Number Minutes Paraffin 10 Minutes   RUE Paraffin Location Hand   Comments At [facility] to increase ROM and decrease pain      LUE Paraffin   Number Minutes Paraffin 10 Minutes   LUE Paraffin Location Hand   Comments at [facility] to decrease pain and increase ROM       Measurements taken - see flowsheet  And grip and prehension strength  L improve more than R  After parafin pain decrease to 2-3/10   Soft tissue mobs to lateral bands of PIP's  - joint mobs to MC's and gentle traction on digits no pain  Prior to ROM      Tendon glides   AROM for MC flexion - PIP extention  And intrinsic fist blocked  - Add and done composite flexion PROM  - stop when feeling pull -  Needed mod A  And max v/c Full fist to 4 cm foam block on R hand and 2 cm foam block for L hand  Opposition to each digits- pad on pad possible in  L , but R hand still unable for 3rd thru 5th  Cont with joint protection - reviewed again with pt              OT Education - 06/27/16 0935    Education provided Yes   Education Details HEP   Person(s) Educated Patient   Methods Explanation;Demonstration;Tactile cues;Verbal cues;Handout   Comprehension Verbal cues required;Returned demonstration;Verbalized understanding          OT Short Term Goals - 06/27/16 1002      OT SHORT TERM GOAL #1   Title Pain on PRWHE improve by at least 10 points    Baseline 24/50 for pain on PRWHE   Time 3   Period Weeks   Status On-going           OT [place] Term Goals - 06/27/16 1002      OT [place] TERM GOAL #1   Title Pt to be independent in HEP to increase/maintain  R and L hand AROM in digiits to increase function on PRWHE   Baseline See flowsheet for  ROM - PRWHE for function 27.5/50 at eval   Time 4   Period Weeks   Status On-going     OT [place] TERM GOAL #2   Title Pt to verbalize understanding for 3  joint  protection and AE  to use to increase ease of performing ADL's and IADL's    Time 4   Period Weeks   Status On-going     OT [place] TERM GOAL #3   Title Grip strength in R and L hand improve with 3-5 lbs    Baseline Grip L 11, R 22   Time 4   Period Weeks   Status On-going               Plan - 06/27/16 0936    Clinical Impression Statement Pt made some progress in AROM in R hand again - and grip/prehension - pt still has some potential to increase AROM in some joints and digits - as well as grip - pain still increase but coming down - did add some PROM - will check on pt in 2-3 wks    Rehab Potential Fair   OT Frequency 1x / week   OT Duration 4 weeks   OT Treatment/Interventions Self-care/ADL training;Ultrasound;Parrafin;Therapeutic exercise;Contrast Bath;Patient/family education;Therapeutic exercises;Passive range of motion;Manual Therapy   OT Home Exercise Plan See pt instruction    Consulted and Agree with Plan of Care Patient      Patient will benefit from skilled therapeutic intervention in order to improve the following deficits and impairments:  Impaired flexibility, Decreased range of motion, Decreased coordination, Decreased knowledge of use of DME, Pain, Impaired UE functional use, Decreased strength  Visit Diagnosis: Pain in left hand  Pain in right hand  Stiffness of right hand, not elsewhere classified  Stiffness of left hand, not elsewhere classified  Muscle weakness (generalized)    Problem List Patient Active Problem List   Diagnosis Date Noted  . Primary cancer of upper inner quadrant of right breast ([place]) 06/12/2016  . S/P ORIF (open reduction internal fixation) fracture 03/16/2016  . Awareness of heartbeats 04/03/2015  . Gastroduodenal ulcer 04/03/2015  . Fast heart beat 04/03/2015  . Allergy to environmental factors 04/03/2015  . Cancer of right breast ([place]) 04/03/2015  . Chest pain 01/01/2015  . HYPERLIPIDEMIA-MIXED 04/14/2009  .  HYPERTENSION, UNSPECIFIED 04/14/2009    Rosalyn Gess OTR/L,CLT  06/27/2016, 10:04 AM  [place] [address], [place], [address] Phone: [phone]   Fax:  [phone]  Name: [name] MRN: [id number] Date of Birth: 09-04-1933

## 2016-06-27 NOTE — Patient Instructions (Addendum)
   Contrast   Tendon glides   AROM for MC flexion - PIP extention  And intrinsic fist blocked  - Add  composite flexion PROM  - stop when feeling pull -  Needed mod A  And max v/c Full fist to 4 cm foam block on R hand and 2 cm foam block for L hand  Opposition to each digits- Cont with joint protection - reviewed again with pt

## 2016-07-11 ENCOUNTER — Ambulatory Visit: Payer: Medicare Other | Admitting: Occupational Therapy

## 2016-11-30 ENCOUNTER — Ambulatory Visit: Payer: Medicare Other | Admitting: Obstetrics and Gynecology

## 2016-12-13 ENCOUNTER — Inpatient Hospital Stay: Payer: Medicare Other

## 2016-12-13 ENCOUNTER — Inpatient Hospital Stay: Payer: Medicare Other | Attending: Internal Medicine | Admitting: Internal Medicine

## 2016-12-13 VITALS — BP 129/72 | HR 66 | Temp 97.2°F | Wt 158.4 lb

## 2016-12-13 DIAGNOSIS — D0591 Unspecified type of carcinoma in situ of right breast: Secondary | ICD-10-CM | POA: Diagnosis not present

## 2016-12-13 DIAGNOSIS — K219 Gastro-esophageal reflux disease without esophagitis: Secondary | ICD-10-CM | POA: Insufficient documentation

## 2016-12-13 DIAGNOSIS — Z170 Estrogen receptor positive status [ER+]: Secondary | ICD-10-CM | POA: Diagnosis not present

## 2016-12-13 DIAGNOSIS — Z79811 Long term (current) use of aromatase inhibitors: Secondary | ICD-10-CM | POA: Insufficient documentation

## 2016-12-13 DIAGNOSIS — E785 Hyperlipidemia, unspecified: Secondary | ICD-10-CM | POA: Diagnosis not present

## 2016-12-13 DIAGNOSIS — C50211 Malignant neoplasm of upper-inner quadrant of right female breast: Secondary | ICD-10-CM

## 2016-12-13 DIAGNOSIS — Z9011 Acquired absence of right breast and nipple: Secondary | ICD-10-CM | POA: Diagnosis not present

## 2016-12-13 DIAGNOSIS — R748 Abnormal levels of other serum enzymes: Secondary | ICD-10-CM | POA: Diagnosis not present

## 2016-12-13 DIAGNOSIS — Z87891 Personal history of nicotine dependence: Secondary | ICD-10-CM | POA: Diagnosis not present

## 2016-12-13 DIAGNOSIS — M064 Inflammatory polyarthropathy: Secondary | ICD-10-CM | POA: Insufficient documentation

## 2016-12-13 DIAGNOSIS — Z7982 Long term (current) use of aspirin: Secondary | ICD-10-CM | POA: Insufficient documentation

## 2016-12-13 DIAGNOSIS — Z800 Family history of malignant neoplasm of digestive organs: Secondary | ICD-10-CM | POA: Diagnosis not present

## 2016-12-13 DIAGNOSIS — G8929 Other chronic pain: Secondary | ICD-10-CM | POA: Insufficient documentation

## 2016-12-13 DIAGNOSIS — Z79899 Other long term (current) drug therapy: Secondary | ICD-10-CM | POA: Diagnosis not present

## 2016-12-13 DIAGNOSIS — I10 Essential (primary) hypertension: Secondary | ICD-10-CM | POA: Insufficient documentation

## 2016-12-13 LAB — CBC WITH DIFFERENTIAL/PLATELET
BASOS ABS: 0 10*3/uL (ref 0–0.1)
BASOS PCT: 1 %
EOS ABS: 0.1 10*3/uL (ref 0–0.7)
Eosinophils Relative: 2 %
HCT: 44.2 % (ref 35.0–47.0)
HEMOGLOBIN: 15.3 g/dL (ref 12.0–16.0)
Lymphocytes Relative: 28 %
Lymphs Abs: 1.9 10*3/uL (ref 1.0–3.6)
MCH: 31 pg (ref 26.0–34.0)
MCHC: 34.7 g/dL (ref 32.0–36.0)
MCV: 89.4 fL (ref 80.0–100.0)
MONOS PCT: 10 %
Monocytes Absolute: 0.7 10*3/uL (ref 0.2–0.9)
NEUTROS PCT: 59 %
Neutro Abs: 3.9 10*3/uL (ref 1.4–6.5)
Platelets: 270 10*3/uL (ref 150–440)
RBC: 4.95 MIL/uL (ref 3.80–5.20)
RDW: 14.2 % (ref 11.5–14.5)
WBC: 6.6 10*3/uL (ref 3.6–11.0)

## 2016-12-13 LAB — COMPREHENSIVE METABOLIC PANEL
ALK PHOS: 145 U/L — AB (ref 38–126)
ALT: 26 U/L (ref 14–54)
AST: 33 U/L (ref 15–41)
Albumin: 4.3 g/dL (ref 3.5–5.0)
Anion gap: 8 (ref 5–15)
BILIRUBIN TOTAL: 1.4 mg/dL — AB (ref 0.3–1.2)
BUN: 22 mg/dL — ABNORMAL HIGH (ref 6–20)
CALCIUM: 9.5 mg/dL (ref 8.9–10.3)
CO2: 23 mmol/L (ref 22–32)
CREATININE: 0.81 mg/dL (ref 0.44–1.00)
Chloride: 105 mmol/L (ref 101–111)
GFR calc non Af Amer: >60 mL/min (ref >60)
Glucose, Bld: 126 mg/dL — ABNORMAL HIGH (ref 65–99)
Potassium: 4.1 mmol/L (ref 3.5–5.1)
SODIUM: 136 mmol/L (ref 135–145)
TOTAL PROTEIN: 7.6 g/dL (ref 6.5–8.1)

## 2016-12-13 NOTE — Progress Notes (Signed)
[facility] OFFICE PROGRESS NOTE  Patient Care Team: Idelle Crouch, MD as PCP - General (Internal Medicine) Idelle Crouch, MD (Internal Medicine) Seeplaputhur Robinette Haines, MD (General Surgery)  Cancer Staging No matching staging information was found for the patient.    Oncology History   v1.  Carcinoma of right breast 2 different reasons. 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   3.  Status post right breast mastectomy With tumor size of T1 cN0 M0.  Estrogen receptor positive.  Progesterone receptor positive.  Sentinel lymph nodes were negative.  (May, 2016) mammo print  Low risk  Patient has been started on letrozole from April 29, 2015     Carcinoma of upper-inner quadrant of right breast in female, estrogen receptor positive ([place])      INTERVAL HISTORY:  [name] 83 y.o.  female pleasant patient above history of Breast cancer-stage I is here for follow-up. She continues to be on letrozole.  She denies any worsening of chronic aches and pains in her joints. Denies any unusual lumps or bumps. Denies any nausea vomiting. Denies any headaches. Denies any bone pain.   REVIEW OF SYSTEMS:  A complete 10 point review of system is done which is negative except mentioned above/history of present illness.   PAST MEDICAL HISTORY :  Past Medical History:  Diagnosis Date  . Cancer of right breast ([place]) 02/16/15   Right breast, T1c, NO, MO; ER/PR pos. Her 2 neg  . Chronic cough   . Dysrhythmia   . Elevated lipase   . GERD (gastroesophageal reflux disease)   . Heart murmur   . Hyperlipidemia    Mixed  . Hypertension    Unspecified  . Inflammatory arthritis   . Lichen planus    oral and vulvar  .  Osteoarthritis    hands, cervical spine and trigger nodules  . Peptic ulcer   . Reactive airway disease   . Seasonal allergies   . Vaginal atrophy   . Valvular disease   . Vasomotor instability     PAST SURGICAL HISTORY :   Past Surgical History:  Procedure Laterality Date  . AXILLARY LYMPH NODE BIOPSY Right 03/19/2015   Procedure: AXILLARY LYMPH NODE BIOPSY;  Surgeon: Christene Lye, MD;  Location: [facility] ORS;  Service: General;  Laterality: Right;  . BREAST BIOPSY Right 02/16/15  . BREAST EXCISIONAL BIOPSY Left ~ 2012   "non malignant"  . BREAST LUMPECTOMY Left 2012  . CARPAL TUNNEL RELEASE Right   . COLONOSCOPY    . COLONOSCOPY W/ POLYPECTOMY    . ESOPHAGOGASTRODUODENOSCOPY (EGD) WITH PROPOFOL N/A 07/28/2015   Procedure: ESOPHAGOGASTRODUODENOSCOPY (EGD) WITH PROPOFOL;  Surgeon: Manya Silvas, MD;  Location: [facility] ENDOSCOPY;  Service: Endoscopy;  Laterality: N/A;  . MASTECTOMY    . ORIF HUMERUS FRACTURE Right 03/16/2016   Procedure: OPEN REDUCTION INTERNAL FIXATION (ORIF) RIGHT PROXIMAL HUMERUS FRACTURE;  Surgeon: Justice Britain, MD;  Location: [facility];  Service: Orthopedics;  Laterality: Right;  . ORIF PROXIMAL HUMERUS FRACTURE Right 03/16/2016  . SENTINEL NODE BIOPSY Right 03/19/2015   Procedure: SENTINEL NODE BIOPSY;  Surgeon: Christene Lye, MD;  Location: [facility] ORS;  Service: General;  Laterality: Right;  . SIMPLE MASTECTOMY WITH AXILLARY SENTINEL NODE BIOPSY Right 03/19/2015   Procedure: right total  MASTECTOMY with sentinel node biopsy ;  Surgeon: Christene Lye, MD;  Location: [facility] ORS;  Service: General;  Laterality: Right;  Marland Kitchen VAGINOPLASTY      FAMILY HISTORY :   Family History  Problem Relation Age of Onset  . Colon cancer Paternal Grandfather 12  . Colon cancer Paternal Uncle 95  . Heart disease Father   . Heart disease Mother   . Diabetes Mother   . Diabetes Daughter     SOCIAL HISTORY:   Social History  Substance Use Topics  . Smoking status:  Former Smoker    Quit date: 11/06/1966  . Smokeless tobacco: Never Used  . Alcohol use 4.2 oz/week    7 Glasses of wine per week     Comment: wine at night    ALLERGIES:  has No Known Allergies.  MEDICATIONS:  Current Outpatient Prescriptions  Medication Sig Dispense Refill  . aspirin 81 MG EC tablet Take 81 mg by mouth daily.      Marland Kitchen atorvastatin (LIPITOR) 20 MG tablet Take 20 mg by mouth at bedtime.     . Calcium Carbonate-Vitamin D (CALCIUM-VITAMIN D3) 600-200 MG-UNIT TABS Take 1 tablet by mouth 2 (two) times daily.      . clobetasol ointment (TEMOVATE) 0.05 % Apply topically.    Marland Kitchen letrozole (FEMARA) 2.5 MG tablet Take 1 tablet (2.5 mg total) by mouth daily. 30 tablet 2  . Multiple Vitamins-Minerals (CENTRUM) tablet Take 1 tablet by mouth daily.      . nebivolol (BYSTOLIC) 10 MG tablet TAKE 1 TABLET ONCE DAILY   PLEASE MAKE AN APPOINTMENT WITH YOUR DOCTOR    . pantoprazole (PROTONIX) 40 MG tablet Take 40 mg by mouth 2 (two) times daily.      . metoprolol succinate (TOPROL-XL) 25 MG 24 hr tablet Take 25 mg by mouth daily.      No current facility-administered medications for this visit.     PHYSICAL EXAMINATION: ECOG PERFORMANCE STATUS: 1 - Symptomatic but completely ambulatory  BP 129/72 (BP Location: Left Arm, Patient Position: Sitting)   Pulse 66   Temp 97.2 F (36.2 C) (Tympanic)   Wt 158 lb 6 oz (71.8 kg)   BMI 27.19 kg/m   Filed Weights   12/13/16 1009  Weight: 158 lb 6 oz (71.8 kg)    GENERAL: Well-nourished well-developed; Alert, no distress and comfortable.  Alone. EYES: no pallor or icterus OROPHARYNX: no thrush or ulceration; good dentition  NECK: supple, no masses felt LYMPH:  no palpable lymphadenopathy in the cervical, axillary or inguinal regions LUNGS: clear to auscultation and  No wheeze or crackles HEART/CVS: regular rate & rhythm and no murmurs; No lower extremity edema ABDOMEN:abdomen soft, non-tender and normal bowel sounds Musculoskeletal:no  cyanosis of digits and no clubbing  PSYCH: alert & oriented x 3 with fluent speech NEURO: no focal motor/sensory deficits SKIN:  no rashes or significant lesions Right and left BREAST exam [in the presence of nurse]- no unusual skin changes or dominant masses felt. Surgical scars noted.    LABORATORY DATA:  I have reviewed the data as listed    Component Value Date/Time   NA 136 12/13/2016 0928   K 4.1 12/13/2016 0928   CL 105 12/13/2016 0928   CO2 23 12/13/2016 0928   GLUCOSE 126 (H) 12/13/2016 0928   BUN 22 (H) 12/13/2016 0928   CREATININE 0.81 12/13/2016 0928   CALCIUM 9.5 12/13/2016 0928   PROT 7.6 12/13/2016 0928   ALBUMIN 4.3 12/13/2016 5621  AST 33 12/13/2016 0928   ALT 26 12/13/2016 0928   ALKPHOS 145 (H) 12/13/2016 0928   BILITOT 1.4 (H) 12/13/2016 0928   GFRNONAA >60 12/13/2016 0928   GFRAA >60 12/13/2016 0928    No results found for: SPEP, UPEP  Lab Results  Component Value Date   WBC 6.6 12/13/2016   NEUTROABS 3.9 12/13/2016   HGB 15.3 12/13/2016   HCT 44.2 12/13/2016   MCV 89.4 12/13/2016   PLT 270 12/13/2016      Chemistry      Component Value Date/Time   NA 136 12/13/2016 0928   K 4.1 12/13/2016 0928   CL 105 12/13/2016 0928   CO2 23 12/13/2016 0928   BUN 22 (H) 12/13/2016 0928   CREATININE 0.81 12/13/2016 0928      Component Value Date/Time   CALCIUM 9.5 12/13/2016 0928   ALKPHOS 145 (H) 12/13/2016 0928   AST 33 12/13/2016 0928   ALT 26 12/13/2016 0928   BILITOT 1.4 (H) 12/13/2016 8638       RADIOGRAPHIC STUDIES: I have personally reviewed the radiological images as listed and agreed with the findings in the report. No results found.   ASSESSMENT & PLAN:  Carcinoma of upper-inner quadrant of right breast in female, estrogen receptor positive ([place]) # Stage I ER/PR- pos; her 2 neu- NEG. S/p mastec; Low risk- mammaprint; cont Letrozole; NED. Mamm- march 2017-NEG. Clinically no evidence of recurrence.   # Arthrtis- sec to letrzole; on  chondroitin/glucosamine- improved.   # Elevated Alk Phos- 145/ improving; but not resolving; repeat LFTs in 3 months  # BMD- ordered with mammo [Dr.sankar] in march 2018.   # follow up in 6 months/labs;  # 25 minutes face-to-face with the patient discussing the above plan of care; more than 50% of time spent on prognosis/ natural history; counseling and coordination.    Orders Placed This Encounter  Procedures  . DG Bone Density    Standing Status:   Future    Standing Expiration Date:   12/13/2017    Order Specific Question:   Reason for Exam (SYMPTOM  OR DIAGNOSIS REQUIRED)    Answer:   aromatse inhibitor use    Order Specific Question:   Preferred imaging location?    Answer:   [facility]  . Hepatic function panel    Standing Status:   Future    Standing Expiration Date:   12/13/2017  . CBC with Differential/Platelet    Standing Status:   Future    Standing Expiration Date:   06/12/2017  . Comprehensive metabolic panel    Standing Status:   Future    Standing Expiration Date:   06/12/2017   All questions were answered. The patient knows to call the clinic with any problems, questions or concerns.      Cammie Sickle, MD 12/13/2016 1:35 PM

## 2016-12-13 NOTE — Progress Notes (Signed)
Patient here today for follow up.   

## 2016-12-13 NOTE — Assessment & Plan Note (Addendum)
#  Stage I ER/PR- pos; her 2 neu- NEG. S/p mastec; Low risk- mammaprint; cont Letrozole; NED. Mamm- march 2017-NEG. Clinically no evidence of recurrence.   # Arthrtis- sec to letrzole; on chondroitin/glucosamine- improved.   # Elevated Alk Phos- 145/ improving; but not resolving; repeat LFTs in 3 months  # BMD- ordered with mammo [Dr.sankar] in march 2018.   # follow up in 6 months/labs;  # 25 minutes face-to-face with the patient discussing the above plan of care; more than 50% of time spent on prognosis/ natural history; counseling and coordination.

## 2016-12-21 ENCOUNTER — Other Ambulatory Visit: Payer: Self-pay | Admitting: Internal Medicine

## 2016-12-21 DIAGNOSIS — Z1231 Encounter for screening mammogram for malignant neoplasm of breast: Secondary | ICD-10-CM

## 2016-12-22 ENCOUNTER — Other Ambulatory Visit: Payer: Self-pay

## 2016-12-22 DIAGNOSIS — Z1231 Encounter for screening mammogram for malignant neoplasm of breast: Secondary | ICD-10-CM

## 2017-01-31 ENCOUNTER — Other Ambulatory Visit: Payer: Self-pay | Admitting: Internal Medicine

## 2017-01-31 DIAGNOSIS — R1901 Right upper quadrant abdominal swelling, mass and lump: Secondary | ICD-10-CM

## 2017-02-05 ENCOUNTER — Ambulatory Visit
Admission: RE | Admit: 2017-02-05 | Discharge: 2017-02-05 | Disposition: A | Discharge status: Home / Self Care | Payer: Medicare Other | Source: Ambulatory Visit | Attending: Internal Medicine | Admitting: Internal Medicine

## 2017-02-05 DIAGNOSIS — Z853 Personal history of malignant neoplasm of breast: Secondary | ICD-10-CM | POA: Diagnosis not present

## 2017-02-05 DIAGNOSIS — M810 Age-related osteoporosis without current pathological fracture: Secondary | ICD-10-CM | POA: Insufficient documentation

## 2017-02-05 DIAGNOSIS — Z1231 Encounter for screening mammogram for malignant neoplasm of breast: Secondary | ICD-10-CM

## 2017-02-05 DIAGNOSIS — Z13820 Encounter for screening for osteoporosis: Secondary | ICD-10-CM | POA: Insufficient documentation

## 2017-02-05 DIAGNOSIS — Z79811 Long term (current) use of aromatase inhibitors: Secondary | ICD-10-CM | POA: Diagnosis not present

## 2017-02-05 DIAGNOSIS — Z9011 Acquired absence of right breast and nipple: Secondary | ICD-10-CM | POA: Diagnosis not present

## 2017-02-05 HISTORY — DX: Malignant neoplasm of unspecified site of unspecified female breast: C50.919

## 2017-02-07 ENCOUNTER — Ambulatory Visit
Admission: RE | Admit: 2017-02-07 | Discharge: 2017-02-07 | Disposition: A | Discharge status: Home / Self Care | Payer: Medicare Other | Source: Ambulatory Visit | Attending: Internal Medicine | Admitting: Internal Medicine

## 2017-02-07 DIAGNOSIS — K449 Diaphragmatic hernia without obstruction or gangrene: Secondary | ICD-10-CM | POA: Diagnosis not present

## 2017-02-07 DIAGNOSIS — R1901 Right upper quadrant abdominal swelling, mass and lump: Secondary | ICD-10-CM

## 2017-02-07 DIAGNOSIS — I700 Atherosclerosis of aorta: Secondary | ICD-10-CM | POA: Insufficient documentation

## 2017-02-07 MED ORDER — IOPAMIDOL (ISOVUE-300) INJECTION 61%
100.0000 mL | Freq: Once | INTRAVENOUS | Status: AC | PRN
  Administered 2017-02-07: 100 mL via INTRAVENOUS

## 2017-02-13 ENCOUNTER — Encounter: Payer: Self-pay | Admitting: General Surgery

## 2017-02-13 ENCOUNTER — Ambulatory Visit (INDEPENDENT_AMBULATORY_CARE_PROVIDER_SITE_OTHER): Payer: Medicare Other | Admitting: General Surgery

## 2017-02-13 VITALS — BP 150/72 | HR 76 | Resp 16 | Ht 63.0 in | Wt 162.0 lb

## 2017-02-13 DIAGNOSIS — Z170 Estrogen receptor positive status [ER+]: Secondary | ICD-10-CM

## 2017-02-13 DIAGNOSIS — R17 Unspecified jaundice: Secondary | ICD-10-CM | POA: Diagnosis not present

## 2017-02-13 DIAGNOSIS — C50211 Malignant neoplasm of upper-inner quadrant of right female breast: Secondary | ICD-10-CM | POA: Diagnosis not present

## 2017-02-13 NOTE — Patient Instructions (Addendum)
The patient is aware to call back for any questions or concerns.  Recommend abdominal ultrasound assess gall bladder due to elevated bilirubin.   The patient has been asked to return to the office in one year with a unilateral left breast diagnostic mammogram with Dr Bary Castilla.  The patient is scheduled for Ultrasound of the Gallbladder at [facility] on 02/16/17 at 9:30 am. She will arrive by 9:15 am and have nothing to eat or drink for 6 hours prior. The patient is aware of date, time, and instructions.

## 2017-02-13 NOTE — Progress Notes (Signed)
Patient ID: [name], female   DOB: 05-Jan-1933, 83 y.o.   MRN: [id number]  Chief Complaint  Patient presents with  . Follow-up    HPI [name] is a 83 y.o. female.  who presents for right breast cancer and a breast evaluation. The most recent mammogram was done on 02-05-17.  Patient does perform regular self breast checks and gets regular mammograms done.  She has a knot below right mastectomy site, CT scan was done on 02-07-17. Bone density was 02-05-17. Tolerating the Femara. She denies any nausea, vomiting or belching.  HPI  Past Medical History:  Diagnosis Date  . Breast cancer ([place]) 2016   RT MASTECTOMY  . Cancer of right breast ([place]) 02/16/15   Right breast, T1c, NO, [place]; ER/PR pos. Her 2 neg  . Chronic cough   . Dysrhythmia   . Elevated lipase   . GERD (gastroesophageal reflux disease)   . Heart murmur   . Hyperlipidemia    Mixed  . Hypertension    Unspecified  . Inflammatory arthritis   . Lichen planus    oral and vulvar  . Osteoarthritis    hands, cervical spine and trigger nodules  . Peptic ulcer   . Reactive airway disease   . Seasonal allergies   . Vaginal atrophy   . Valvular disease   . Vasomotor instability     Past Surgical History:  Procedure Laterality Date  . AXILLARY LYMPH NODE BIOPSY Right 03/19/2015   Procedure: AXILLARY LYMPH NODE BIOPSY;  Surgeon: Christene Lye, MD;  Location: [facility] ORS;  Service: General;  Laterality: Right;  . BREAST BIOPSY Right 02/16/15   POS  . BREAST EXCISIONAL BIOPSY Left ~ 2012   "non malignant"  . CARPAL TUNNEL RELEASE Right   . COLONOSCOPY    . COLONOSCOPY W/ POLYPECTOMY    . ESOPHAGOGASTRODUODENOSCOPY (EGD) WITH PROPOFOL N/A 07/28/2015   Procedure: ESOPHAGOGASTRODUODENOSCOPY (EGD) WITH PROPOFOL;  Surgeon: Manya Silvas, MD;  Location: [facility] ENDOSCOPY;  Service: Endoscopy;  Laterality: N/A;  . MASTECTOMY Right 2016   BREAST CA  . ORIF HUMERUS FRACTURE Right 03/16/2016   Procedure: OPEN REDUCTION INTERNAL  FIXATION (ORIF) RIGHT PROXIMAL HUMERUS FRACTURE;  Surgeon: Justice Britain, MD;  Location: [facility];  Service: Orthopedics;  Laterality: Right;  . ORIF PROXIMAL HUMERUS FRACTURE Right 03/16/2016  . SENTINEL NODE BIOPSY Right 03/19/2015   Procedure: SENTINEL NODE BIOPSY;  Surgeon: Christene Lye, MD;  Location: [facility] ORS;  Service: General;  Laterality: Right;  . SIMPLE MASTECTOMY WITH AXILLARY SENTINEL NODE BIOPSY Right 03/19/2015   Procedure: right total MASTECTOMY with sentinel node biopsy ;  Surgeon: Christene Lye, MD;  Location: [facility] ORS;  Service: General;  Laterality: Right;  Marland Kitchen VAGINOPLASTY      Family History  Problem Relation Age of Onset  . Colon cancer Paternal Grandfather 51  . Colon cancer Paternal Uncle 66  . Heart disease Father   . Heart disease Mother   . Diabetes Mother   . Diabetes Daughter   . Breast cancer Neg Hx     Social History Social History  Substance Use Topics  . Smoking status: Former Smoker    Quit date: 11/06/1966  . Smokeless tobacco: Never Used  . Alcohol use 4.2 oz/week    7 Glasses of wine per week     Comment: wine at night    No Known Allergies  Current Outpatient Prescriptions  Medication Sig Dispense Refill  . aspirin 81 MG EC tablet  Take 81 mg by mouth daily.      Marland Kitchen atorvastatin (LIPITOR) 20 MG tablet Take 20 mg by mouth at bedtime.     . Calcium Carbonate-Vitamin D (CALCIUM-VITAMIN D3) 600-200 MG-UNIT TABS Take 1 tablet by mouth 2 (two) times daily.      . clobetasol ointment (TEMOVATE) 0.05 % Apply topically.    Marland Kitchen letrozole (FEMARA) 2.5 MG tablet Take 1 tablet (2.5 mg total) by mouth daily. 30 tablet 2  . metoprolol succinate (TOPROL-XL) 25 MG 24 hr tablet Take 25 mg by mouth daily.     . Multiple Vitamins-Minerals (CENTRUM) tablet Take 1 tablet by mouth daily.      . nebivolol (BYSTOLIC) 10 MG tablet TAKE 1 TABLET ONCE DAILY   PLEASE MAKE AN APPOINTMENT WITH YOUR DOCTOR    . pantoprazole (PROTONIX) 40 MG tablet Take 40 mg by  mouth 2 (two) times daily.       No current facility-administered medications for this visit.     Review of Systems Review of Systems  Constitutional: Negative.   Respiratory: Negative.   Cardiovascular: Negative.   Gastrointestinal: Negative.     Blood pressure (!) 150/72, pulse 76, resp. rate 16, height 5\' 3"  (1.6 m), weight 162 lb (73.5 kg).  Physical Exam Physical Exam  Constitutional: She is oriented to person, place, and time. She appears well-developed and well-nourished.  HENT:  Mouth/Throat: Oropharynx is clear and moist.  Eyes: Conjunctivae are normal. No scleral icterus.  Neck: Neck supple.  Cardiovascular: Normal rate, regular rhythm and normal heart sounds.   Pulmonary/Chest: Effort normal and breath sounds normal. Left breast exhibits no inverted nipple, no mass, no nipple discharge, no skin change and no tenderness.    chronic left nipple retractation. Right mastectomy site clean.   Abdominal: Soft. Normal appearance and bowel sounds are normal. There is no tenderness.  Lymphadenopathy:    She has no cervical adenopathy.    She has no axillary adenopathy.  Neurological: She is alert and oriented to person, place, and time.  Skin: Skin is warm and dry.  Psychiatric: Her behavior is normal.    Data Reviewed Mammogram, recent labs and CT scan.  Assessment    2 years post mastectomy for invasive CA in [facility], DCIS in [facility]. ER/PR pos and Her 2 negative. Recent labs showed some mild elevation of her bilirubin but CT was nonrevealing for any biliary abnormality      Plan    Recommend abdominal ultrasound assess gall bladder due to elevated bilirubin.   The patient has been asked to return to the office in one year with a unilateral left breast diagnostic mammogram with Dr Bary Castilla. Her last bone density was 02-05-17.     HPI, Physical Exam, Assessment and Plan have been scribed under the direction and in the presence of Mckinley Jewel, MD  Karie Fetch,  RN  The patient is scheduled for Ultrasound of the Gallbladder at [place] [facility] on 02/16/17 at 9:30 am. She will arrive by 9:15 am and have nothing to eat or drink for 6 hours prior. The patient is aware of date, time, and instructions.     Documented by Lesly Rubenstein LPN I have completed the exam and reviewed the above documentation for accuracy and completeness.  I agree with the above.  Haematologist has been used and any errors in dictation or transcription are unintentional.  Seeplaputhur G. Jamal Collin, M.D., F.A.C.S.   Junie Panning G 02/19/2017, 8:42 AM

## 2017-02-16 ENCOUNTER — Ambulatory Visit
Admission: RE | Admit: 2017-02-16 | Discharge: 2017-02-16 | Disposition: A | Discharge status: Home / Self Care | Payer: Medicare Other | Source: Ambulatory Visit | Attending: General Surgery | Admitting: General Surgery

## 2017-02-16 DIAGNOSIS — R17 Unspecified jaundice: Secondary | ICD-10-CM | POA: Diagnosis present

## 2017-02-16 DIAGNOSIS — K760 Fatty (change of) liver, not elsewhere classified: Secondary | ICD-10-CM | POA: Diagnosis not present

## 2017-03-12 ENCOUNTER — Inpatient Hospital Stay: Payer: Medicare Other | Attending: Internal Medicine

## 2017-03-12 DIAGNOSIS — C50211 Malignant neoplasm of upper-inner quadrant of right female breast: Secondary | ICD-10-CM | POA: Insufficient documentation

## 2017-03-12 DIAGNOSIS — Z170 Estrogen receptor positive status [ER+]: Secondary | ICD-10-CM | POA: Insufficient documentation

## 2017-03-12 DIAGNOSIS — Z79811 Long term (current) use of aromatase inhibitors: Secondary | ICD-10-CM | POA: Insufficient documentation

## 2017-03-12 DIAGNOSIS — Z9011 Acquired absence of right breast and nipple: Secondary | ICD-10-CM | POA: Insufficient documentation

## 2017-03-12 DIAGNOSIS — D0591 Unspecified type of carcinoma in situ of right breast: Secondary | ICD-10-CM | POA: Insufficient documentation

## 2017-04-04 ENCOUNTER — Inpatient Hospital Stay: Payer: Medicare Other

## 2017-04-04 DIAGNOSIS — C50211 Malignant neoplasm of upper-inner quadrant of right female breast: Secondary | ICD-10-CM | POA: Diagnosis not present

## 2017-04-04 DIAGNOSIS — Z9011 Acquired absence of right breast and nipple: Secondary | ICD-10-CM | POA: Diagnosis not present

## 2017-04-04 DIAGNOSIS — D0591 Unspecified type of carcinoma in situ of right breast: Secondary | ICD-10-CM | POA: Diagnosis not present

## 2017-04-04 DIAGNOSIS — Z79811 Long term (current) use of aromatase inhibitors: Secondary | ICD-10-CM | POA: Diagnosis not present

## 2017-04-04 DIAGNOSIS — Z170 Estrogen receptor positive status [ER+]: Secondary | ICD-10-CM | POA: Diagnosis not present

## 2017-04-04 LAB — HEPATIC FUNCTION PANEL
ALK PHOS: 175 U/L — AB (ref 38–126)
ALT: 31 U/L (ref 14–54)
AST: 34 U/L (ref 15–41)
Albumin: 4.7 g/dL (ref 3.5–5.0)
BILIRUBIN DIRECT: 0.3 mg/dL (ref 0.1–0.5)
BILIRUBIN INDIRECT: 1.4 mg/dL — AB (ref 0.3–0.9)
Total Bilirubin: 1.7 mg/dL — ABNORMAL HIGH (ref 0.3–1.2)
Total Protein: 8 g/dL (ref 6.5–8.1)

## 2017-04-05 ENCOUNTER — Other Ambulatory Visit: Payer: Self-pay | Admitting: Internal Medicine

## 2017-04-05 DIAGNOSIS — C50211 Malignant neoplasm of upper-inner quadrant of right female breast: Secondary | ICD-10-CM

## 2017-04-05 DIAGNOSIS — R748 Abnormal levels of other serum enzymes: Secondary | ICD-10-CM | POA: Insufficient documentation

## 2017-04-05 DIAGNOSIS — Z170 Estrogen receptor positive status [ER+]: Principal | ICD-10-CM

## 2017-04-05 NOTE — Progress Notes (Signed)
Please inform patient that her alkaline phosphatase continues to be slightly elevated; recent abdominal CT was negative. So would recommend further evaluation with a bone scan; and results available/ please check with covering provider; and inform pt. I have ordered the bone scan. Thanks.

## 2017-04-06 NOTE — Progress Notes (Signed)
Voice mail msg left for patient 04/06/17 at 1500 for patient to return my phone call

## 2017-04-09 NOTE — Progress Notes (Signed)
1159 am 04/09/17 - Attempted vm msg - asking patient to return my phone call ([phone])

## 2017-04-09 NOTE — Progress Notes (Signed)
[name], I have tried to reach patient. I have left vm. Pt will need a Nuc Med. Bone Scan scheduled.

## 2017-04-13 ENCOUNTER — Telehealth: Payer: Self-pay | Admitting: *Deleted

## 2017-04-13 NOTE — Telephone Encounter (Signed)
-----   Message from Bill Salinas sent at 04/13/2017 11:44 AM EDT ----- Contact: [phone] PT L/M that she has a bone scan end of June-she said she had just had 1 recently-does she need another already???-thx

## 2017-04-17 NOTE — Telephone Encounter (Addendum)
Spoke with pt. Explained that a bone density is not the same test as a nuc med bone scan.  Pt gave verbal understanding but requested to change the apt date for this Nuc med bone scan. She will be out of town and will not be back until 6/26. msg sent to cancer center scheduling team to contact pt with new apt.

## 2017-04-30 ENCOUNTER — Ambulatory Visit: Payer: Medicare Other

## 2017-05-18 ENCOUNTER — Encounter
Admission: RE | Admit: 2017-05-18 | Discharge: 2017-05-18 | Disposition: A | Discharge status: Home / Self Care | Payer: Medicare Other | Source: Ambulatory Visit | Attending: Internal Medicine | Admitting: Internal Medicine

## 2017-05-18 ENCOUNTER — Ambulatory Visit
Admission: RE | Admit: 2017-05-18 | Discharge: 2017-05-18 | Disposition: A | Discharge status: Home / Self Care | Payer: Medicare Other | Source: Ambulatory Visit | Attending: Internal Medicine | Admitting: Internal Medicine

## 2017-05-18 ENCOUNTER — Other Ambulatory Visit: Payer: Self-pay | Admitting: *Deleted

## 2017-05-18 DIAGNOSIS — R748 Abnormal levels of other serum enzymes: Secondary | ICD-10-CM | POA: Insufficient documentation

## 2017-05-18 DIAGNOSIS — Z170 Estrogen receptor positive status [ER+]: Secondary | ICD-10-CM

## 2017-05-18 DIAGNOSIS — C50211 Malignant neoplasm of upper-inner quadrant of right female breast: Secondary | ICD-10-CM | POA: Diagnosis not present

## 2017-05-18 MED ORDER — LETROZOLE 2.5 MG PO TABS: 2.5000 mg | ORAL_TABLET | Freq: Every day | ORAL | 0 refills | Status: DC

## 2017-05-18 MED ORDER — TECHNETIUM TC 99M MEDRONATE IV KIT
22.2130 | PACK | Freq: Once | INTRAVENOUS | Status: AC | PRN
  Administered 2017-05-18: 22.213 via INTRAVENOUS

## 2017-05-25 ENCOUNTER — Telehealth: Payer: Self-pay | Admitting: *Deleted

## 2017-05-25 NOTE — Telephone Encounter (Signed)
Notes recorded by Sabino Gasser, RN on 05/25/2017 at 2:42 PM EDT Left vm for pt - requesting call back

## 2017-05-25 NOTE — Telephone Encounter (Signed)
-----   Message from Cammie Sickle, MD sent at 05/23/2017  6:07 PM EDT ----- Please inform patient that bone scan is negative for any cancer. Follow-up as planned.

## 2017-06-13 ENCOUNTER — Inpatient Hospital Stay: Payer: Medicare Other | Attending: Internal Medicine

## 2017-06-13 ENCOUNTER — Inpatient Hospital Stay (HOSPITAL_BASED_OUTPATIENT_CLINIC_OR_DEPARTMENT_OTHER): Payer: Medicare Other | Admitting: Internal Medicine

## 2017-06-13 VITALS — BP 159/81 | HR 71 | Temp 97.0°F | Wt 159.0 lb

## 2017-06-13 DIAGNOSIS — E785 Hyperlipidemia, unspecified: Secondary | ICD-10-CM | POA: Insufficient documentation

## 2017-06-13 DIAGNOSIS — C50211 Malignant neoplasm of upper-inner quadrant of right female breast: Secondary | ICD-10-CM

## 2017-06-13 DIAGNOSIS — K219 Gastro-esophageal reflux disease without esophagitis: Secondary | ICD-10-CM

## 2017-06-13 DIAGNOSIS — Z79899 Other long term (current) drug therapy: Secondary | ICD-10-CM | POA: Diagnosis not present

## 2017-06-13 DIAGNOSIS — Z7982 Long term (current) use of aspirin: Secondary | ICD-10-CM | POA: Insufficient documentation

## 2017-06-13 DIAGNOSIS — Z87891 Personal history of nicotine dependence: Secondary | ICD-10-CM

## 2017-06-13 DIAGNOSIS — Z8711 Personal history of peptic ulcer disease: Secondary | ICD-10-CM

## 2017-06-13 DIAGNOSIS — M064 Inflammatory polyarthropathy: Secondary | ICD-10-CM

## 2017-06-13 DIAGNOSIS — Z170 Estrogen receptor positive status [ER+]: Secondary | ICD-10-CM

## 2017-06-13 DIAGNOSIS — M810 Age-related osteoporosis without current pathological fracture: Secondary | ICD-10-CM | POA: Insufficient documentation

## 2017-06-13 DIAGNOSIS — Z9011 Acquired absence of right breast and nipple: Secondary | ICD-10-CM | POA: Diagnosis not present

## 2017-06-13 DIAGNOSIS — Z79811 Long term (current) use of aromatase inhibitors: Secondary | ICD-10-CM | POA: Insufficient documentation

## 2017-06-13 DIAGNOSIS — I10 Essential (primary) hypertension: Secondary | ICD-10-CM

## 2017-06-13 LAB — COMPREHENSIVE METABOLIC PANEL
ALT: 29 U/L (ref 14–54)
AST: 29 U/L (ref 15–41)
Albumin: 4.4 g/dL (ref 3.5–5.0)
Alkaline Phosphatase: 91 U/L (ref 38–126)
Anion gap: 12 (ref 5–15)
BUN: 14 mg/dL (ref 6–20)
CHLORIDE: 102 mmol/L (ref 101–111)
CO2: 23 mmol/L (ref 22–32)
CREATININE: 0.83 mg/dL (ref 0.44–1.00)
Calcium: 9.3 mg/dL (ref 8.9–10.3)
GFR calc Af Amer: >60 mL/min (ref >60)
GLUCOSE: 115 mg/dL — AB (ref 65–99)
POTASSIUM: 4 mmol/L (ref 3.5–5.1)
Sodium: 137 mmol/L (ref 135–145)
Total Bilirubin: 1.4 mg/dL — ABNORMAL HIGH (ref 0.3–1.2)
Total Protein: 7.8 g/dL (ref 6.5–8.1)

## 2017-06-13 LAB — CBC WITH DIFFERENTIAL/PLATELET
BASOS ABS: 0 10*3/uL (ref 0–0.1)
BASOS PCT: 1 %
Eosinophils Absolute: 0.1 10*3/uL (ref 0–0.7)
Eosinophils Relative: 1 %
HEMATOCRIT: 44.1 % (ref 35.0–47.0)
Hemoglobin: 15.4 g/dL (ref 12.0–16.0)
LYMPHS PCT: 25 %
Lymphs Abs: 1.7 10*3/uL (ref 1.0–3.6)
MCH: 31.9 pg (ref 26.0–34.0)
MCHC: 34.9 g/dL (ref 32.0–36.0)
MCV: 91.2 fL (ref 80.0–100.0)
MONO ABS: 0.6 10*3/uL (ref 0.2–0.9)
Monocytes Relative: 9 %
NEUTROS ABS: 4.4 10*3/uL (ref 1.4–6.5)
Neutrophils Relative %: 64 %
PLATELETS: 245 10*3/uL (ref 150–440)
RBC: 4.83 MIL/uL (ref 3.80–5.20)
RDW: 15 % — AB (ref 11.5–14.5)
WBC: 6.9 10*3/uL (ref 3.6–11.0)

## 2017-06-13 NOTE — Assessment & Plan Note (Addendum)
#  Stage I ER/PR- pos; her 2 neu- NEG. S/p mastec; Low risk- mammaprint; cont Letrozole; NED. Mamm- April 2018--NEG. Clinically no evidence of recurrence.   # Arthrtis- sec to letrzole; on chondroitin/glucosamine- improved.   # BMD- April 2018- T=-2.5; early osteoporosis. Discussed multiple options for the treatment of osteoporosis including-exercise/lifting weights; also calcium vitamin D; also discussed the use of oral bisphosphonates. Also discussed use of Reclast/Prolia. Discussed the pros and cons of each treatment options in detail. After extensive discussion patient- wants to try conservative measures; avoidance any medications at this time. I think this is reasonable. Will repeat in 1 year [may 2018]  # Elevated Alk Phos- 145- resolved;  bone scan NOrmal.   # follow up in may 2019/ BMD; cbc/cmp.

## 2017-06-13 NOTE — Progress Notes (Signed)
[place] OFFICE PROGRESS NOTE  Patient Care Team: Idelle Crouch, MD as PCP - General (Internal Medicine) Doy Hutching Leonie Douglas, MD (Internal Medicine) Christene Lye, MD (General Surgery)  Cancer Staging No matching staging information was found for the patient.    Oncology History   v1.  Carcinoma of right breast 2 different reasons. 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   3.  Status post right breast mastectomy With tumor size of T1 cN0 M0.  Estrogen receptor positive.  Progesterone receptor positive.  Sentinel lymph nodes were negative.  (May, 2016) mammo print  Low risk  Patient has been started on letrozole from April 29, 2015     Carcinoma of upper-inner quadrant of right breast in female, estrogen receptor positive ([place])      INTERVAL HISTORY:  [name] 84 y.o.  female pleasant patient above history of Breast cancer-stage I is here for follow-up. She continues to be on letrozole.  In the interim patient was evaluated by Dr. Jamal Collin. She denies any falls. Denies any fractures.  Joint pains improve on chondroitin/glucosamine. Denies any unusual lumps or bumps. Denies any nausea vomiting. Denies any headaches. Denies any bone pain.   REVIEW OF SYSTEMS:  A complete 10 point review of system is done which is negative except mentioned above/history of present illness.   PAST MEDICAL HISTORY :  Past Medical History:  Diagnosis Date  . Breast cancer ([place]) 2016   RT MASTECTOMY  . Cancer of right breast ([place]) 02/16/15   Right breast, T1c, NO, MO; ER/PR pos. Her 2 neg  . Chronic cough   . Dysrhythmia   . Elevated lipase   . GERD (gastroesophageal reflux disease)   . Heart murmur   .  Hyperlipidemia    Mixed  . Hypertension    Unspecified  . Inflammatory arthritis   . Lichen planus    oral and vulvar  . Osteoarthritis    hands, cervical spine and trigger nodules  . Peptic ulcer   . Reactive airway disease   . Seasonal allergies   . Vaginal atrophy   . Valvular disease   . Vasomotor instability     PAST SURGICAL HISTORY :   Past Surgical History:  Procedure Laterality Date  . AXILLARY LYMPH NODE BIOPSY Right 03/19/2015   Procedure: AXILLARY LYMPH NODE BIOPSY;  Surgeon: Christene Lye, MD;  Location: [facility] ORS;  Service: General;  Laterality: Right;  . BREAST BIOPSY Right 02/16/15   POS  . BREAST EXCISIONAL BIOPSY Left ~ 2012   "non malignant"  . CARPAL TUNNEL RELEASE Right   . COLONOSCOPY    . COLONOSCOPY W/ POLYPECTOMY    . ESOPHAGOGASTRODUODENOSCOPY (EGD) WITH PROPOFOL N/A 07/28/2015   Procedure: ESOPHAGOGASTRODUODENOSCOPY (EGD) WITH PROPOFOL;  Surgeon: Manya Silvas, MD;  Location: [facility] ENDOSCOPY;  Service: Endoscopy;  Laterality: N/A;  . MASTECTOMY Right 2016   BREAST CA  . ORIF HUMERUS FRACTURE Right 03/16/2016   Procedure: OPEN REDUCTION INTERNAL FIXATION (ORIF) RIGHT PROXIMAL HUMERUS FRACTURE;  Surgeon: Justice Britain, MD;  Location: [place];  Service: Orthopedics;  Laterality: Right;  . ORIF PROXIMAL HUMERUS FRACTURE Right 03/16/2016  . SENTINEL NODE BIOPSY Right 03/19/2015   Procedure: SENTINEL NODE BIOPSY;  Surgeon: Christene Lye, MD;  Location: [facility] ORS;  Service: General;  Laterality: Right;  . SIMPLE MASTECTOMY WITH AXILLARY SENTINEL NODE BIOPSY Right 03/19/2015   Procedure: right total MASTECTOMY with sentinel node biopsy ;  Surgeon: Christene Lye, MD;  Location: [facility] ORS;  Service: General;  Laterality: Right;  Marland Kitchen VAGINOPLASTY      FAMILY HISTORY :   Family History  Problem Relation Age of Onset  . Colon cancer Paternal Grandfather 18  . Colon cancer Paternal Uncle 78  . Heart disease Father   . Heart disease Mother   .  Diabetes Mother   . Diabetes Daughter   . Breast cancer Neg Hx     SOCIAL HISTORY:   Social History  Substance Use Topics  . Smoking status: Former Smoker    Quit date: 11/06/1966  . Smokeless tobacco: Never Used  . Alcohol use 4.2 oz/week    7 Glasses of wine per week     Comment: wine at night    ALLERGIES:  has No Known Allergies.  MEDICATIONS:  Current Outpatient Prescriptions  Medication Sig Dispense Refill  . aspirin 81 MG EC tablet Take 81 mg by mouth daily.      Marland Kitchen atorvastatin (LIPITOR) 20 MG tablet Take 20 mg by mouth at bedtime.     . Calcium Carbonate-Vitamin D (CALCIUM-VITAMIN D3) 600-200 MG-UNIT TABS Take 1 tablet by mouth 2 (two) times daily.      Marland Kitchen letrozole (FEMARA) 2.5 MG tablet Take 1 tablet (2.5 mg total) by mouth daily. 90 tablet 0  . metoprolol succinate (TOPROL-XL) 25 MG 24 hr tablet Take 25 mg by mouth daily.     . Multiple Vitamins-Minerals (CENTRUM) tablet Take 1 tablet by mouth daily.      . nebivolol (BYSTOLIC) 10 MG tablet TAKE 1 TABLET ONCE DAILY   PLEASE MAKE AN APPOINTMENT WITH YOUR DOCTOR    . pantoprazole (PROTONIX) 40 MG tablet Take 40 mg by mouth 2 (two) times daily.      . clobetasol ointment (TEMOVATE) 0.05 % Apply topically.     No current facility-administered medications for this visit.     PHYSICAL EXAMINATION: ECOG PERFORMANCE STATUS: 1 - Symptomatic but completely ambulatory  BP (!) 159/81 (BP Location: Left Arm, Patient Position: Sitting)   Pulse 71   Temp (!) 97 F (36.1 C) (Tympanic)   Wt 159 lb (72.1 kg)   BMI 28.17 kg/m   Filed Weights   06/13/17 1046  Weight: 159 lb (72.1 kg)    GENERAL: Well-nourished well-developed; Alert, no distress and comfortable.  Alone. EYES: no pallor or icterus OROPHARYNX: no thrush or ulceration; good dentition  NECK: supple, no masses felt LYMPH:  no palpable lymphadenopathy in the cervical, axillary or inguinal regions LUNGS: clear to auscultation and  No wheeze or crackles HEART/CVS:  regular rate & rhythm and no murmurs; No lower extremity edema ABDOMEN:abdomen soft, non-tender and normal bowel sounds Musculoskeletal:no cyanosis of digits and no clubbing  PSYCH: alert & oriented x 3 with fluent speech NEURO: no focal motor/sensory deficits SKIN:  no rashes or significant lesions Right and left BREAST exam [in the presence of nurse]- no unusual skin changes or dominant masses felt. Surgical scars noted.    LABORATORY DATA:  I have reviewed the data as listed    Component Value Date/Time   NA 137 06/13/2017 1019   K 4.0 06/13/2017 1019   CL 102 06/13/2017 1019   CO2 23 06/13/2017 1019   GLUCOSE 115 (H) 06/13/2017 1019   BUN 14 06/13/2017 1019  CREATININE 0.83 06/13/2017 1019   CALCIUM 9.3 06/13/2017 1019   PROT 7.8 06/13/2017 1019   ALBUMIN 4.4 06/13/2017 1019   AST 29 06/13/2017 1019   ALT 29 06/13/2017 1019   ALKPHOS 91 06/13/2017 1019   BILITOT 1.4 (H) 06/13/2017 1019   GFRNONAA >60 06/13/2017 1019   GFRAA >60 06/13/2017 1019    No results found for: SPEP, UPEP  Lab Results  Component Value Date   WBC 6.9 06/13/2017   NEUTROABS 4.4 06/13/2017   HGB 15.4 06/13/2017   HCT 44.1 06/13/2017   MCV 91.2 06/13/2017   PLT 245 06/13/2017      Chemistry      Component Value Date/Time   NA 137 06/13/2017 1019   K 4.0 06/13/2017 1019   CL 102 06/13/2017 1019   CO2 23 06/13/2017 1019   BUN 14 06/13/2017 1019   CREATININE 0.83 06/13/2017 1019      Component Value Date/Time   CALCIUM 9.3 06/13/2017 1019   ALKPHOS 91 06/13/2017 1019   AST 29 06/13/2017 1019   ALT 29 06/13/2017 1019   BILITOT 1.4 (H) 06/13/2017 1019       RADIOGRAPHIC STUDIES: I have personally reviewed the radiological images as listed and agreed with the findings in the report. No results found.   ASSESSMENT & PLAN:  Carcinoma of upper-inner quadrant of right breast in female, estrogen receptor positive ([place]) # Stage I ER/PR- pos; her 2 neu- NEG. S/p mastec; Low risk-  mammaprint; cont Letrozole; NED. Mamm- April 2018--NEG. Clinically no evidence of recurrence.   # Arthrtis- sec to letrzole; on chondroitin/glucosamine- improved.   # BMD- April 2018- T=-2.5; early osteoporosis. Discussed multiple options for the treatment of osteoporosis including-exercise/lifting weights; also calcium vitamin D; also discussed the use of oral bisphosphonates. Also discussed use of Reclast/Prolia. Discussed the pros and cons of each treatment options in detail. After extensive discussion patient- wants to try conservative measures; avoidance any medications at this time. I think this is reasonable. Will repeat in 1 year [may 2018]  # Elevated Alk Phos- 145- resolved;  bone scan NOrmal.   # follow up in may 2019/ BMD; cbc/cmp.    Orders Placed This Encounter  Procedures  . CBC with Differential/Platelet    Standing Status:   Future    Standing Expiration Date:   06/13/2018  . Comprehensive metabolic panel    Standing Status:   Future    Standing Expiration Date:   06/13/2018   All questions were answered. The patient knows to call the clinic with any problems, questions or concerns.      Cammie Sickle, MD 06/14/2017 8:41 AM

## 2017-06-14 ENCOUNTER — Other Ambulatory Visit: Payer: Self-pay | Admitting: *Deleted

## 2017-06-14 DIAGNOSIS — M810 Age-related osteoporosis without current pathological fracture: Secondary | ICD-10-CM

## 2018-01-07 ENCOUNTER — Other Ambulatory Visit: Payer: Self-pay

## 2018-01-07 DIAGNOSIS — Z1231 Encounter for screening mammogram for malignant neoplasm of breast: Secondary | ICD-10-CM

## 2018-01-07 NOTE — Progress Notes (Signed)
.  scr

## 2018-02-06 ENCOUNTER — Ambulatory Visit
Admission: RE | Admit: 2018-02-06 | Discharge: 2018-02-06 | Disposition: A | Discharge status: Home / Self Care | Payer: Medicare Other | Source: Ambulatory Visit | Attending: General Surgery | Admitting: General Surgery

## 2018-02-06 ENCOUNTER — Other Ambulatory Visit: Payer: Self-pay | Admitting: General Surgery

## 2018-02-06 DIAGNOSIS — Z1231 Encounter for screening mammogram for malignant neoplasm of breast: Secondary | ICD-10-CM | POA: Diagnosis present

## 2018-02-20 ENCOUNTER — Other Ambulatory Visit: Payer: Medicare Other

## 2018-02-21 ENCOUNTER — Ambulatory Visit (INDEPENDENT_AMBULATORY_CARE_PROVIDER_SITE_OTHER): Payer: Medicare Other | Admitting: General Surgery

## 2018-02-21 ENCOUNTER — Encounter: Payer: Self-pay | Admitting: General Surgery

## 2018-02-21 VITALS — BP 122/68 | HR 70 | Resp 14 | Ht 64.0 in | Wt 159.0 lb

## 2018-02-21 DIAGNOSIS — C50211 Malignant neoplasm of upper-inner quadrant of right female breast: Secondary | ICD-10-CM | POA: Diagnosis not present

## 2018-02-21 DIAGNOSIS — Z170 Estrogen receptor positive status [ER+]: Secondary | ICD-10-CM

## 2018-02-21 NOTE — Progress Notes (Signed)
Patient ID: [name], female   DOB: [birth year], 84 y.o.   MRN: [id number]  Chief Complaint  Patient presents with  . Follow-up    HPI [name] is a 84 y.o. female who presents for a breast evaluation. The most recent left breast mammogram was done on 02/06/2018.  Patient does perform regular self breast checks and gets regular mammograms done.    HPI  Past Medical History:  Diagnosis Date  . Breast cancer ([place]) 2016   RT MASTECTOMY  . Cancer of right breast ([place]) 02/16/15   Right breast, T1c, NO, MO; ER/PR pos. Her 2 neg  . Chronic cough   . Dysrhythmia   . Elevated lipase   . GERD (gastroesophageal reflux disease)   . Heart murmur   . Hyperlipidemia    Mixed  . Hypertension    Unspecified  . Inflammatory arthritis   . Lichen planus    oral and vulvar  . Osteoarthritis    hands, cervical spine and trigger nodules  . Peptic ulcer   . Reactive airway disease   . Seasonal allergies   . Vaginal atrophy   . Valvular disease   . Vasomotor instability     Past Surgical History:  Procedure Laterality Date  . AXILLARY LYMPH NODE BIOPSY Right 03/19/2015   Procedure: AXILLARY LYMPH NODE BIOPSY;  Surgeon: Christene Lye, MD;  Location: [facility] ORS;  Service: General;  Laterality: Right;  . BREAST BIOPSY Right 02/16/15   POS  . BREAST EXCISIONAL BIOPSY Left ~ 2012   "non malignant"  . CARPAL TUNNEL RELEASE Right   . COLONOSCOPY    . COLONOSCOPY W/ POLYPECTOMY    . ESOPHAGOGASTRODUODENOSCOPY (EGD) WITH PROPOFOL N/A 07/28/2015   Procedure: ESOPHAGOGASTRODUODENOSCOPY (EGD) WITH PROPOFOL;  Surgeon: Manya Silvas, MD;  Location: [facility] ENDOSCOPY;  Service: Endoscopy;  Laterality: N/A;  . MASTECTOMY Right 2016   BREAST CA  . ORIF HUMERUS FRACTURE Right 03/16/2016   Procedure: OPEN REDUCTION INTERNAL FIXATION (ORIF) RIGHT PROXIMAL HUMERUS FRACTURE;  Surgeon: Justice Britain, MD;  Location: [place];  Service: Orthopedics;  Laterality: Right;  . ORIF PROXIMAL HUMERUS FRACTURE  Right 03/16/2016  . SENTINEL NODE BIOPSY Right 03/19/2015   Procedure: SENTINEL NODE BIOPSY;  Surgeon: Christene Lye, MD;  Location: [facility] ORS;  Service: General;  Laterality: Right;  . SIMPLE MASTECTOMY WITH AXILLARY SENTINEL NODE BIOPSY Right 03/19/2015   Procedure: right total MASTECTOMY with sentinel node biopsy ;  Surgeon: Christene Lye, MD;  Location: [facility] ORS;  Service: General;  Laterality: Right;  Marland Kitchen VAGINOPLASTY      Family History  Problem Relation Age of Onset  . Colon cancer Paternal Grandfather 61  . Colon cancer Paternal Uncle 41  . Heart disease Father   . Heart disease Mother   . Diabetes Mother   . Diabetes Daughter   . Breast cancer Neg Hx     Social History Social History   Tobacco Use  . Smoking status: Former Smoker    Last attempt to quit: 11/06/1966    Years since quitting: 51.3  . Smokeless tobacco: Never Used  Substance Use Topics  . Alcohol use: Yes    Alcohol/week: 4.2 oz    Types: 7 Glasses of wine per week    Comment: wine at night  . Drug use: No    No Known Allergies  Current Outpatient Medications  Medication Sig Dispense Refill  . aspirin 81 MG EC tablet Take 81 mg by mouth daily.      Marland Kitchen  atorvastatin (LIPITOR) 20 MG tablet Take 20 mg by mouth at bedtime.     . Calcium Carbonate-Vitamin D (CALCIUM-VITAMIN D3) 600-200 MG-UNIT TABS Take 1 tablet by mouth 2 (two) times daily.      Marland Kitchen letrozole (FEMARA) 2.5 MG tablet Take 1 tablet (2.5 mg total) by mouth daily. 90 tablet 0  . metoprolol succinate (TOPROL-XL) 25 MG 24 hr tablet Take 25 mg by mouth daily.     . Multiple Vitamins-Minerals (CENTRUM) tablet Take 1 tablet by mouth daily.      . nebivolol (BYSTOLIC) 10 MG tablet TAKE 1 TABLET ONCE DAILY   PLEASE MAKE AN APPOINTMENT WITH YOUR DOCTOR    . pantoprazole (PROTONIX) 40 MG tablet Take 40 mg by mouth 2 (two) times daily.       No current facility-administered medications for this visit.     Review of Systems Review of Systems   Constitutional: Negative.   Respiratory: Negative.   Cardiovascular: Negative.     Blood pressure 122/68, pulse 70, resp. rate 14, height 5\' 4"  (1.626 m), weight 159 lb (72.1 kg).  Physical Exam Physical Exam  Constitutional: She is oriented to person, place, and time. She appears well-developed and well-nourished.  Eyes: Conjunctivae are normal. No scleral icterus.  Neck: Neck supple.  Cardiovascular: Normal rate, regular rhythm and normal heart sounds.  Pulmonary/Chest: Effort normal and breath sounds normal. Left breast exhibits inverted nipple. Left breast exhibits no mass, no nipple discharge, no skin change and no tenderness.    Right mastectomy is well healed.   Lymphadenopathy:    She has no cervical adenopathy.  Neurological: She is alert and oriented to person, place, and time.  Skin: Skin is warm and dry.    Data Reviewed Left screening mammogram dated February 06, 2018 reviewed, stellate scarring in the upper inner quadrant consistent with previous biopsy.  BI-RADS-1.  Medical oncology notes of February 11, 2017 reviewed.  Assessment    Doing well status post right mastectomy.  Opportunity for excision of redundant skin discussed.    Plan   The patient has been asked to return to the office in one year with a left breast diagnostic mammogram. The patient is aware to call back for any questions or concerns.   HPI, Physical Exam, Assessment and Plan have been scribed under the direction and in the presence of Hervey Ard, MD.  Gaspar Cola, CMA  I have completed the exam and reviewed the above documentation for accuracy and completeness.  I agree with the above.  Haematologist has been used and any errors in dictation or transcription are unintentional.  Hervey Ard, M.D., F.A.C.S.  [name] 02/21/2018, 11:02 AM

## 2018-02-21 NOTE — Patient Instructions (Signed)
The patient has been asked to return to the office in one year with a left breast diagnostic mammogram. The patient is aware to call back for any questions or concerns.

## 2018-02-25 ENCOUNTER — Other Ambulatory Visit: Payer: Self-pay | Admitting: Internal Medicine

## 2018-03-13 ENCOUNTER — Inpatient Hospital Stay: Payer: Medicare Other | Attending: Internal Medicine

## 2018-03-13 ENCOUNTER — Inpatient Hospital Stay (HOSPITAL_BASED_OUTPATIENT_CLINIC_OR_DEPARTMENT_OTHER): Payer: Medicare Other | Admitting: Internal Medicine

## 2018-03-13 VITALS — BP 162/65 | HR 79 | Resp 16

## 2018-03-13 DIAGNOSIS — Z79811 Long term (current) use of aromatase inhibitors: Secondary | ICD-10-CM | POA: Insufficient documentation

## 2018-03-13 DIAGNOSIS — Z170 Estrogen receptor positive status [ER+]: Secondary | ICD-10-CM | POA: Diagnosis not present

## 2018-03-13 DIAGNOSIS — Z79899 Other long term (current) drug therapy: Secondary | ICD-10-CM

## 2018-03-13 DIAGNOSIS — M064 Inflammatory polyarthropathy: Secondary | ICD-10-CM | POA: Diagnosis not present

## 2018-03-13 DIAGNOSIS — Z8711 Personal history of peptic ulcer disease: Secondary | ICD-10-CM | POA: Insufficient documentation

## 2018-03-13 DIAGNOSIS — Z87891 Personal history of nicotine dependence: Secondary | ICD-10-CM

## 2018-03-13 DIAGNOSIS — I10 Essential (primary) hypertension: Secondary | ICD-10-CM | POA: Insufficient documentation

## 2018-03-13 DIAGNOSIS — Z9011 Acquired absence of right breast and nipple: Secondary | ICD-10-CM

## 2018-03-13 DIAGNOSIS — E785 Hyperlipidemia, unspecified: Secondary | ICD-10-CM

## 2018-03-13 DIAGNOSIS — C50211 Malignant neoplasm of upper-inner quadrant of right female breast: Secondary | ICD-10-CM | POA: Diagnosis present

## 2018-03-13 DIAGNOSIS — K219 Gastro-esophageal reflux disease without esophagitis: Secondary | ICD-10-CM | POA: Insufficient documentation

## 2018-03-13 DIAGNOSIS — Z7982 Long term (current) use of aspirin: Secondary | ICD-10-CM

## 2018-03-13 DIAGNOSIS — M810 Age-related osteoporosis without current pathological fracture: Secondary | ICD-10-CM | POA: Diagnosis not present

## 2018-03-13 LAB — CBC WITH DIFFERENTIAL/PLATELET
Basophils Absolute: 0 10*3/uL (ref 0–0.1)
Basophils Relative: 1 %
EOS ABS: 0.1 10*3/uL (ref 0–0.7)
Eosinophils Relative: 2 %
HEMATOCRIT: 42 % (ref 35.0–47.0)
HEMOGLOBIN: 14.6 g/dL (ref 12.0–16.0)
LYMPHS PCT: 28 %
Lymphs Abs: 1.8 10*3/uL (ref 1.0–3.6)
MCH: 31.5 pg (ref 26.0–34.0)
MCHC: 34.9 g/dL (ref 32.0–36.0)
MCV: 90.4 fL (ref 80.0–100.0)
Monocytes Absolute: 0.6 10*3/uL (ref 0.2–0.9)
Monocytes Relative: 10 %
NEUTROS ABS: 3.7 10*3/uL (ref 1.4–6.5)
NEUTROS PCT: 59 %
Platelets: 233 10*3/uL (ref 150–440)
RBC: 4.65 MIL/uL (ref 3.80–5.20)
RDW: 14.5 % (ref 11.5–14.5)
WBC: 6.2 10*3/uL (ref 3.6–11.0)

## 2018-03-13 LAB — COMPREHENSIVE METABOLIC PANEL
ALBUMIN: 4.4 g/dL (ref 3.5–5.0)
ALK PHOS: 96 U/L (ref 38–126)
ALT: 24 U/L (ref 14–54)
AST: 32 U/L (ref 15–41)
Anion gap: 9 (ref 5–15)
BUN: 15 mg/dL (ref 6–20)
CALCIUM: 9.2 mg/dL (ref 8.9–10.3)
CO2: 22 mmol/L (ref 22–32)
CREATININE: 0.74 mg/dL (ref 0.44–1.00)
Chloride: 109 mmol/L (ref 101–111)
GFR calc non Af Amer: >60 mL/min (ref >60)
GLUCOSE: 115 mg/dL — AB (ref 65–99)
Potassium: 3.9 mmol/L (ref 3.5–5.1)
SODIUM: 140 mmol/L (ref 135–145)
Total Bilirubin: 1.2 mg/dL (ref 0.3–1.2)
Total Protein: 7.3 g/dL (ref 6.5–8.1)

## 2018-03-13 MED ORDER — LETROZOLE 2.5 MG PO TABS: 2.5000 mg | ORAL_TABLET | Freq: Every day | ORAL | 3 refills | Status: DC

## 2018-03-13 NOTE — Assessment & Plan Note (Addendum)
#  Stage I right breast ER PR positive HER-2/neu negative breast cancer low risk mamma print; on adjuvant letrozole.  # Clinically no evidence of recurrence; mammogram April 2019 normal limits.  #Mild to moderate arthritis-secondary letrozole; improved; on chondroitin glucosamine.   # BMD- April 2018- T=-2.5; early osteoporosis/walking; will repeat bone density in 2 years.  #Follow-up in 6 months labs.

## 2018-03-13 NOTE — Progress Notes (Signed)
[place] OFFICE PROGRESS NOTE  Patient Care Team: Idelle Crouch, MD as PCP - General (Internal Medicine) Doy Hutching Leonie Douglas, MD (Internal Medicine) Christene Lye, MD (General Surgery)  Cancer Staging No matching staging information was found for the patient.   Oncology History   v1.  Carcinoma of right breast 2 different reasons. 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   3.  Status post right breast mastectomy With tumor size of T1 cN0 M0.  Estrogen receptor positive.  Progesterone receptor positive.  Sentinel lymph nodes were negative.  (May, 2016) mammo print  Low risk  Patient has been started on letrozole from April 29, 2015     Carcinoma of upper-inner quadrant of right breast in female, estrogen receptor positive ([place])      INTERVAL HISTORY:  [name] 84 y.o.  female pleasant patient above history of upper history of stage I ER PR positive breast cancer currently on letrozole is here for follow-up.  Patient denies any new lumps or bumps.  Appetite is good.  No weight loss.  No nausea no vomiting.  Denies any chest pain or shortness of breath or cough.  Chronic mild arthritic pain improved.   Review of Systems  Constitutional: Negative for chills, diaphoresis, fever, malaise/fatigue and weight loss.  HENT: Negative for nosebleeds and sore throat.   Eyes: Negative for double vision.  Respiratory: Negative for cough, hemoptysis, sputum production, shortness of breath and wheezing.   Cardiovascular: Negative for chest pain, palpitations, orthopnea and leg swelling.  Gastrointestinal: Negative for abdominal pain, blood in stool, constipation, diarrhea, heartburn, melena, nausea and  vomiting.  Genitourinary: Negative for dysuria, frequency and urgency.  Musculoskeletal: Negative for back pain and joint pain.  Skin: Negative.  Negative for itching and rash.  Neurological: Negative for dizziness, tingling, focal weakness, weakness and headaches.  Endo/Heme/Allergies: Does not bruise/bleed easily.  Psychiatric/Behavioral: Negative for depression. The patient is not nervous/anxious and does not have insomnia.       PAST MEDICAL HISTORY :  Past Medical History:  Diagnosis Date  . Breast cancer ([place]) 2016   RT MASTECTOMY  . Cancer of right breast ([place]) 02/16/2015   Right breast, T1c, NO, MO; ER/PR pos. Her 2 neg, low risk Mammoprint score  . Chronic cough   . Dysrhythmia   . Elevated lipase   . GERD (gastroesophageal reflux disease)   . Heart murmur   . Hyperlipidemia    Mixed  . Hypertension    Unspecified  . Inflammatory arthritis   . Lichen planus    oral and vulvar  . Osteoarthritis    hands, cervical spine and trigger nodules  . Peptic ulcer   . Reactive airway disease   . Seasonal allergies   . Vaginal atrophy   . Valvular disease   . Vasomotor instability     PAST SURGICAL HISTORY :   Past Surgical History:  Procedure Laterality Date  . AXILLARY LYMPH NODE BIOPSY Right 03/19/2015   Procedure: AXILLARY LYMPH NODE BIOPSY;  Surgeon: Christene Lye, MD;  Location: [facility] ORS;  Service: General;  Laterality: Right;  . BREAST BIOPSY Right 02/16/15   POS  . BREAST EXCISIONAL BIOPSY Left ~ 2012   "non malignant"  . CARPAL TUNNEL RELEASE Right   . COLONOSCOPY    .  COLONOSCOPY W/ POLYPECTOMY    . ESOPHAGOGASTRODUODENOSCOPY (EGD) WITH PROPOFOL N/A 07/28/2015   Procedure: ESOPHAGOGASTRODUODENOSCOPY (EGD) WITH PROPOFOL;  Surgeon: Manya Silvas, MD;  Location: [facility] ENDOSCOPY;  Service: Endoscopy;  Laterality: N/A;  . MASTECTOMY Right 2016   BREAST CA  . ORIF HUMERUS FRACTURE Right 03/16/2016   Procedure: OPEN REDUCTION INTERNAL FIXATION (ORIF)  RIGHT PROXIMAL HUMERUS FRACTURE;  Surgeon: Justice Britain, MD;  Location: [facility];  Service: Orthopedics;  Laterality: Right;  . ORIF PROXIMAL HUMERUS FRACTURE Right 03/16/2016  . SENTINEL NODE BIOPSY Right 03/19/2015   Procedure: SENTINEL NODE BIOPSY;  Surgeon: Christene Lye, MD;  Location: [facility] ORS;  Service: General;  Laterality: Right;  . SIMPLE MASTECTOMY WITH AXILLARY SENTINEL NODE BIOPSY Right 03/19/2015   Procedure: right total MASTECTOMY with sentinel node biopsy ;  Surgeon: Christene Lye, MD;  Location: [facility] ORS;  Service: General;  Laterality: Right;  Marland Kitchen VAGINOPLASTY      FAMILY HISTORY :   Family History  Problem Relation Age of Onset  . Colon cancer Paternal Grandfather 56  . Colon cancer Paternal Uncle 4  . Heart disease Father   . Heart disease Mother   . Diabetes Mother   . Diabetes Daughter   . Breast cancer Neg Hx     SOCIAL HISTORY:   Social History   Tobacco Use  . Smoking status: Former Smoker    Last attempt to quit: 11/06/1966    Years since quitting: 51.3  . Smokeless tobacco: Never Used  Substance Use Topics  . Alcohol use: Yes    Alcohol/week: 4.2 oz    Types: 7 Glasses of wine per week    Comment: wine at night  . Drug use: No    ALLERGIES:  has No Known Allergies.  MEDICATIONS:  Current Outpatient Medications  Medication Sig Dispense Refill  . aspirin 81 MG EC tablet Take 81 mg by mouth daily.      Marland Kitchen atorvastatin (LIPITOR) 20 MG tablet Take 20 mg by mouth at bedtime.     . Calcium Carbonate-Vitamin D (CALCIUM-VITAMIN D3) 600-200 MG-UNIT TABS Take 1 tablet by mouth 2 (two) times daily.      Marland Kitchen letrozole (FEMARA) 2.5 MG tablet Take 1 tablet (2.5 mg total) by mouth daily. 90 tablet 3  . metoprolol succinate (TOPROL-XL) 25 MG 24 hr tablet Take 25 mg by mouth daily.     . Multiple Vitamins-Minerals (CENTRUM) tablet Take 1 tablet by mouth daily.      . nebivolol (BYSTOLIC) 10 MG tablet TAKE 1 TABLET ONCE DAILY   PLEASE MAKE AN APPOINTMENT  WITH YOUR DOCTOR    . pantoprazole (PROTONIX) 40 MG tablet Take 40 mg by mouth 2 (two) times daily.       No current facility-administered medications for this visit.     PHYSICAL EXAMINATION: ECOG PERFORMANCE STATUS: 0 - Asymptomatic  BP (!) 162/65 (BP Location: Left Arm, Patient Position: Sitting)   Pulse 79   Resp 16   There were no vitals filed for this visit.  GENERAL: Well-nourished well-developed; Alert, no distress and comfortable alone.Marland Kitchen  EYES: no pallor or icterus OROPHARYNX: no thrush or ulceration; NECK: supple; no lymph nodes felt. LYMPH:  no palpable lymphadenopathy in the axillary or inguinal regions LUNGS: Decreased breath sounds auscultation bilaterally. No wheeze or crackles HEART/CVS: regular rate & rhythm and no murmurs; No lower extremity edema ABDOMEN:abdomen soft, non-tender and normal bowel sounds. No hepatomegaly or splenomegaly.  Musculoskeletal:no cyanosis of digits and no clubbing  PSYCH: alert & oriented x 3 with fluent speech NEURO: no focal motor/sensory deficits SKIN:  no rashes or significant lesions    LABORATORY DATA:  I have reviewed the data as listed    Component Value Date/Time   NA 140 03/13/2018 1051   K 3.9 03/13/2018 1051   CL 109 03/13/2018 1051   CO2 22 03/13/2018 1051   GLUCOSE 115 (H) 03/13/2018 1051   BUN 15 03/13/2018 1051   CREATININE 0.74 03/13/2018 1051   CALCIUM 9.2 03/13/2018 1051   PROT 7.3 03/13/2018 1051   ALBUMIN 4.4 03/13/2018 1051   AST 32 03/13/2018 1051   ALT 24 03/13/2018 1051   ALKPHOS 96 03/13/2018 1051   BILITOT 1.2 03/13/2018 1051   GFRNONAA >60 03/13/2018 1051   GFRAA >60 03/13/2018 1051    No results found for: SPEP, UPEP  Lab Results  Component Value Date   WBC 6.2 03/13/2018   NEUTROABS 3.7 03/13/2018   HGB 14.6 03/13/2018   HCT 42.0 03/13/2018   MCV 90.4 03/13/2018   PLT 233 03/13/2018      Chemistry      Component Value Date/Time   NA 140 03/13/2018 1051   K 3.9 03/13/2018 1051    CL 109 03/13/2018 1051   CO2 22 03/13/2018 1051   BUN 15 03/13/2018 1051   CREATININE 0.74 03/13/2018 1051      Component Value Date/Time   CALCIUM 9.2 03/13/2018 1051   ALKPHOS 96 03/13/2018 1051   AST 32 03/13/2018 1051   ALT 24 03/13/2018 1051   BILITOT 1.2 03/13/2018 1051       RADIOGRAPHIC STUDIES: I have personally reviewed the radiological images as listed and agreed with the findings in the report. No results found.   ASSESSMENT & PLAN:  Carcinoma of upper-inner quadrant of right breast in female, estrogen receptor positive ([place]) #Stage I right breast ER PR positive HER-2/neu negative breast cancer low risk mamma print; on adjuvant letrozole.  # Clinically no evidence of recurrence; mammogram April 2019 normal limits.  #Mild to moderate arthritis-secondary letrozole; improved; on chondroitin glucosamine.   # BMD- April 2018- T=-2.5; early osteoporosis/walking; will repeat bone density in 2 years.  #Follow-up in 6 months labs.   Orders Placed This Encounter  Procedures  . Comprehensive metabolic panel    Standing Status:   Future    Standing Expiration Date:   03/14/2019  . CBC with Differential    Standing Status:   Future    Standing Expiration Date:   03/14/2019   All questions were answered. The patient knows to call the clinic with any problems, questions or concerns.      Cammie Sickle, MD 03/14/2018 7:51 AM

## 2018-03-19 ENCOUNTER — Ambulatory Visit
Admission: RE | Admit: 2018-03-19 | Discharge: 2018-03-19 | Disposition: A | Discharge status: Home / Self Care | Payer: Medicare Other | Source: Ambulatory Visit | Attending: Internal Medicine | Admitting: Internal Medicine

## 2018-03-19 DIAGNOSIS — M810 Age-related osteoporosis without current pathological fracture: Secondary | ICD-10-CM | POA: Diagnosis not present

## 2018-09-13 ENCOUNTER — Ambulatory Visit: Payer: Medicare Other | Admitting: Internal Medicine

## 2018-09-13 ENCOUNTER — Other Ambulatory Visit: Payer: Medicare Other

## 2018-09-16 ENCOUNTER — Other Ambulatory Visit: Payer: Self-pay | Admitting: Internal Medicine

## 2018-09-16 ENCOUNTER — Inpatient Hospital Stay: Payer: Medicare Other | Attending: Internal Medicine

## 2018-09-16 ENCOUNTER — Inpatient Hospital Stay (HOSPITAL_BASED_OUTPATIENT_CLINIC_OR_DEPARTMENT_OTHER): Payer: Medicare Other | Admitting: Internal Medicine

## 2018-09-16 VITALS — BP 130/77 | HR 73 | Temp 98.8°F | Resp 18 | Ht 64.0 in | Wt 160.3 lb

## 2018-09-16 DIAGNOSIS — Z79811 Long term (current) use of aromatase inhibitors: Secondary | ICD-10-CM | POA: Diagnosis not present

## 2018-09-16 DIAGNOSIS — Z9011 Acquired absence of right breast and nipple: Secondary | ICD-10-CM | POA: Insufficient documentation

## 2018-09-16 DIAGNOSIS — C50211 Malignant neoplasm of upper-inner quadrant of right female breast: Secondary | ICD-10-CM | POA: Insufficient documentation

## 2018-09-16 DIAGNOSIS — Z7982 Long term (current) use of aspirin: Secondary | ICD-10-CM | POA: Diagnosis not present

## 2018-09-16 DIAGNOSIS — I10 Essential (primary) hypertension: Secondary | ICD-10-CM | POA: Diagnosis not present

## 2018-09-16 DIAGNOSIS — M810 Age-related osteoporosis without current pathological fracture: Secondary | ICD-10-CM

## 2018-09-16 DIAGNOSIS — Z79899 Other long term (current) drug therapy: Secondary | ICD-10-CM | POA: Insufficient documentation

## 2018-09-16 DIAGNOSIS — Z87891 Personal history of nicotine dependence: Secondary | ICD-10-CM | POA: Diagnosis not present

## 2018-09-16 DIAGNOSIS — M064 Inflammatory polyarthropathy: Secondary | ICD-10-CM | POA: Diagnosis not present

## 2018-09-16 DIAGNOSIS — Z8711 Personal history of peptic ulcer disease: Secondary | ICD-10-CM

## 2018-09-16 DIAGNOSIS — M1990 Unspecified osteoarthritis, unspecified site: Secondary | ICD-10-CM | POA: Diagnosis not present

## 2018-09-16 DIAGNOSIS — E785 Hyperlipidemia, unspecified: Secondary | ICD-10-CM

## 2018-09-16 DIAGNOSIS — K219 Gastro-esophageal reflux disease without esophagitis: Secondary | ICD-10-CM | POA: Diagnosis not present

## 2018-09-16 DIAGNOSIS — Z170 Estrogen receptor positive status [ER+]: Secondary | ICD-10-CM | POA: Diagnosis not present

## 2018-09-16 LAB — CBC WITH DIFFERENTIAL/PLATELET
Abs Immature Granulocytes: 0.03 10*3/uL (ref 0.00–0.07)
BASOS PCT: 1 %
Basophils Absolute: 0 10*3/uL (ref 0.0–0.1)
EOS PCT: 2 %
Eosinophils Absolute: 0.1 10*3/uL (ref 0.0–0.5)
HCT: 41.2 % (ref 36.0–46.0)
HEMOGLOBIN: 13.9 g/dL (ref 12.0–15.0)
Immature Granulocytes: 0 %
LYMPHS PCT: 30 %
Lymphs Abs: 2 10*3/uL (ref 0.7–4.0)
MCH: 30.6 pg (ref 26.0–34.0)
MCHC: 33.7 g/dL (ref 30.0–36.0)
MCV: 90.7 fL (ref 80.0–100.0)
MONO ABS: 0.6 10*3/uL (ref 0.1–1.0)
MONOS PCT: 10 %
Neutro Abs: 3.9 10*3/uL (ref 1.7–7.7)
Neutrophils Relative %: 57 %
PLATELETS: 231 10*3/uL (ref 150–400)
RBC: 4.54 MIL/uL (ref 3.87–5.11)
RDW: 13.9 % (ref 11.5–15.5)
WBC: 6.7 10*3/uL (ref 4.0–10.5)
nRBC: 0 % (ref 0.0–0.2)

## 2018-09-16 LAB — COMPREHENSIVE METABOLIC PANEL
ALBUMIN: 4.2 g/dL (ref 3.5–5.0)
ALK PHOS: 93 U/L (ref 38–126)
ALT: 24 U/L (ref 0–44)
AST: 25 U/L (ref 15–41)
Anion gap: 9 (ref 5–15)
BUN: 18 mg/dL (ref 8–23)
CO2: 25 mmol/L (ref 22–32)
Calcium: 9.4 mg/dL (ref 8.9–10.3)
Chloride: 105 mmol/L (ref 98–111)
Creatinine, Ser: 0.94 mg/dL (ref 0.44–1.00)
GFR calc non Af Amer: 55 mL/min — ABNORMAL LOW (ref >60)
GLUCOSE: 120 mg/dL — AB (ref 70–99)
POTASSIUM: 4.1 mmol/L (ref 3.5–5.1)
SODIUM: 139 mmol/L (ref 135–145)
Total Bilirubin: 1.1 mg/dL (ref 0.3–1.2)
Total Protein: 6.9 g/dL (ref 6.5–8.1)

## 2018-09-16 NOTE — Progress Notes (Signed)
No new changes noted today 

## 2018-09-16 NOTE — Progress Notes (Signed)
[place] OFFICE PROGRESS NOTE  Patient Care Team: Idelle Crouch, MD as PCP - General (Internal Medicine) Doy Hutching Leonie Douglas, MD (Internal Medicine) Christene Lye, MD (General Surgery)  Cancer Staging No matching staging information was found for the patient.   Oncology History   v1.  Carcinoma of right breast 2 different reasons. 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   3.  Status post right breast mastectomy With tumor size of T1 cN0 M0.  Estrogen receptor positive.  Progesterone receptor positive.  Sentinel lymph nodes were negative.  (May, 2016) mammo print  Low risk  Patient has been started on letrozole from April 29, 2015 ----------------------------------------   DIAGNOSIS: RIGHT BREAST CA  STAGE:  I       ;GOALS: cure  CURRENT/MOST RECENT THERAPY; letrzole.       Carcinoma of upper-inner quadrant of right breast in female, estrogen receptor positive ([place])      INTERVAL HISTORY:  [name] 84 y.o.  female pleasant patient above history of upper history of stage I ER PR positive breast cancer currently on letrozole is here for follow-up.  Patient denies any unusual lumps or bumps.  Denies any headaches.  Denies any nausea vomiting.  Denies any bone pain.  Continues to have mild arthritic pain in the joints.  Not any worse.  Review of Systems  Constitutional: Negative for chills, diaphoresis, fever, malaise/fatigue and weight loss.  HENT: Negative for nosebleeds and sore throat.   Eyes: Negative for double vision.  Respiratory: Negative for cough, hemoptysis, sputum production, shortness of breath and wheezing.   Cardiovascular: Negative for chest pain, palpitations, orthopnea  and leg swelling.  Gastrointestinal: Negative for abdominal pain, blood in stool, constipation, diarrhea, heartburn, melena, nausea and vomiting.  Genitourinary: Negative for dysuria, frequency and urgency.  Musculoskeletal: Negative for back pain and joint pain.  Skin: Negative.  Negative for itching and rash.  Neurological: Negative for dizziness, tingling, focal weakness, weakness and headaches.  Endo/Heme/Allergies: Does not bruise/bleed easily.  Psychiatric/Behavioral: Negative for depression. The patient is not nervous/anxious and does not have insomnia.       PAST MEDICAL HISTORY :  Past Medical History:  Diagnosis Date  . Breast cancer ([place]) 2016   RT MASTECTOMY  . Cancer of right breast ([place]) 02/16/2015   Right breast, T1c, NO, MO; ER/PR pos. Her 2 neg, low risk Mammoprint score  . Chronic cough   . Dysrhythmia   . Elevated lipase   . GERD (gastroesophageal reflux disease)   . Heart murmur   . Hyperlipidemia    Mixed  . Hypertension    Unspecified  . Inflammatory arthritis   . Lichen planus    oral and vulvar  . Osteoarthritis    hands, cervical spine and trigger nodules  . Peptic ulcer   . Reactive airway disease   . Seasonal allergies   . Vaginal atrophy   . Valvular disease   . Vasomotor instability     PAST SURGICAL HISTORY :   Past Surgical History:  Procedure Laterality Date  . AXILLARY LYMPH NODE BIOPSY Right 03/19/2015   Procedure: AXILLARY LYMPH NODE BIOPSY;  Surgeon: Christene Lye, MD;  Location: [facility] ORS;  Service: General;  Laterality: Right;  . BREAST BIOPSY Right 02/16/15  POS  . BREAST EXCISIONAL BIOPSY Left ~ 2012   "non malignant"  . CARPAL TUNNEL RELEASE Right   . COLONOSCOPY    . COLONOSCOPY W/ POLYPECTOMY    . ESOPHAGOGASTRODUODENOSCOPY (EGD) WITH PROPOFOL N/A 07/28/2015   Procedure: ESOPHAGOGASTRODUODENOSCOPY (EGD) WITH PROPOFOL;  Surgeon: Manya Silvas, MD;  Location: [facility] ENDOSCOPY;  Service: Endoscopy;  Laterality: N/A;   . MASTECTOMY Right 2016   BREAST CA  . ORIF HUMERUS FRACTURE Right 03/16/2016   Procedure: OPEN REDUCTION INTERNAL FIXATION (ORIF) RIGHT PROXIMAL HUMERUS FRACTURE;  Surgeon: Justice Britain, MD;  Location: [facility];  Service: Orthopedics;  Laterality: Right;  . ORIF PROXIMAL HUMERUS FRACTURE Right 03/16/2016  . SENTINEL NODE BIOPSY Right 03/19/2015   Procedure: SENTINEL NODE BIOPSY;  Surgeon: Christene Lye, MD;  Location: [facility] ORS;  Service: General;  Laterality: Right;  . SIMPLE MASTECTOMY WITH AXILLARY SENTINEL NODE BIOPSY Right 03/19/2015   Procedure: right total MASTECTOMY with sentinel node biopsy ;  Surgeon: Christene Lye, MD;  Location: [facility] ORS;  Service: General;  Laterality: Right;  Marland Kitchen VAGINOPLASTY      FAMILY HISTORY :   Family History  Problem Relation Age of Onset  . Colon cancer Paternal Grandfather 55  . Colon cancer Paternal Uncle 70  . Heart disease Father   . Heart disease Mother   . Diabetes Mother   . Diabetes Daughter   . Breast cancer Neg Hx     SOCIAL HISTORY:   Social History   Tobacco Use  . Smoking status: Former Smoker    Last attempt to quit: 11/06/1966    Years since quitting: 51.8  . Smokeless tobacco: Never Used  Substance Use Topics  . Alcohol use: Yes    Alcohol/week: 7.0 standard drinks    Types: 7 Glasses of wine per week    Comment: wine at night  . Drug use: No    ALLERGIES:  has No Known Allergies.  MEDICATIONS:  Current Outpatient Medications  Medication Sig Dispense Refill  . aspirin 81 MG EC tablet Take 81 mg by mouth daily.      Marland Kitchen atorvastatin (LIPITOR) 20 MG tablet Take 20 mg by mouth at bedtime.     . Calcium Carbonate-Vitamin D (CALCIUM-VITAMIN D3) 600-200 MG-UNIT TABS Take 1 tablet by mouth 2 (two) times daily.      Marland Kitchen letrozole (FEMARA) 2.5 MG tablet Take 1 tablet (2.5 mg total) by mouth daily. 90 tablet 3  . metoprolol succinate (TOPROL-XL) 25 MG 24 hr tablet Take 25 mg by mouth daily.     . Multiple  Vitamins-Minerals (CENTRUM) tablet Take 1 tablet by mouth daily.      . pantoprazole (PROTONIX) 40 MG tablet Take 40 mg by mouth 2 (two) times daily.      . nebivolol (BYSTOLIC) 10 MG tablet TAKE 1 TABLET ONCE DAILY   PLEASE MAKE AN APPOINTMENT WITH YOUR DOCTOR     No current facility-administered medications for this visit.     PHYSICAL EXAMINATION: ECOG PERFORMANCE STATUS: 0 - Asymptomatic  BP 130/77 (BP Location: Left Arm, Patient Position: Sitting)   Pulse 73   Temp 98.8 F (37.1 C) (Tympanic)   Resp 18   Ht '5\' 4"'  (1.626 m)   Wt 160 lb 4.8 oz (72.7 kg)   SpO2 98%   BMI 27.52 kg/m   Filed Weights   09/16/18 1427  Weight: 160 lb 4.8 oz (72.7 kg)   Physical Exam  Constitutional: She is oriented to person, place, and  time and well-developed, well-nourished, and in no distress.  HENT:  Head: Normocephalic and atraumatic.  Mouth/Throat: Oropharynx is clear and moist. No oropharyngeal exudate.  Eyes: Pupils are equal, round, and reactive to light.  Neck: Normal range of motion. Neck supple.  Cardiovascular: Normal rate and regular rhythm.  Pulmonary/Chest: No respiratory distress. She has no wheezes.  Abdominal: Soft. Bowel sounds are normal. She exhibits no distension and no mass. There is no tenderness. There is no rebound and no guarding.  Musculoskeletal: Normal range of motion. She exhibits no edema or tenderness.  Neurological: She is alert and oriented to person, place, and time.  Skin: Skin is warm.  Psychiatric: Affect normal.       LABORATORY DATA:  I have reviewed the data as listed    Component Value Date/Time   NA 139 09/16/2018 1351   K 4.1 09/16/2018 1351   CL 105 09/16/2018 1351   CO2 25 09/16/2018 1351   GLUCOSE 120 (H) 09/16/2018 1351   BUN 18 09/16/2018 1351   CREATININE 0.94 09/16/2018 1351   CALCIUM 9.4 09/16/2018 1351   PROT 6.9 09/16/2018 1351   ALBUMIN 4.2 09/16/2018 1351   AST 25 09/16/2018 1351   ALT 24 09/16/2018 1351   ALKPHOS 93  09/16/2018 1351   BILITOT 1.1 09/16/2018 1351   GFRNONAA 55 (L) 09/16/2018 1351   GFRAA >60 09/16/2018 1351    No results found for: SPEP, UPEP  Lab Results  Component Value Date   WBC 6.7 09/16/2018   NEUTROABS 3.9 09/16/2018   HGB 13.9 09/16/2018   HCT 41.2 09/16/2018   MCV 90.7 09/16/2018   PLT 231 09/16/2018      Chemistry      Component Value Date/Time   NA 139 09/16/2018 1351   K 4.1 09/16/2018 1351   CL 105 09/16/2018 1351   CO2 25 09/16/2018 1351   BUN 18 09/16/2018 1351   CREATININE 0.94 09/16/2018 1351      Component Value Date/Time   CALCIUM 9.4 09/16/2018 1351   ALKPHOS 93 09/16/2018 1351   AST 25 09/16/2018 1351   ALT 24 09/16/2018 1351   BILITOT 1.1 09/16/2018 1351       RADIOGRAPHIC STUDIES: I have personally reviewed the radiological images as listed and agreed with the findings in the report. No results found.   ASSESSMENT & PLAN:  Carcinoma of upper-inner quadrant of right breast in female, estrogen receptor positive ([place]) #Stage I right breast ER PR positive HER-2/neu negative breast cancer low risk mamma print; on adjuvant letrozole.  Stable.  # Clinically no evidence of recurrence; mammogram April 2019 normal limits.  #Mild to moderate arthritis-secondary letrozole; stable.  # BMD- may 2019- T=-2.5; early osteoporosis/walking; Discussed at length the risk factors [race, post-menopausal status, use of AI]; prevention/treatment strategies- including but not limited to exercise calcium and vitamin D twice a day. Discussed re: Oral biphosphonates; and side effects.  Discussed regarding Prolia/Reclast.  Discussed the potential side effects including but not limited to- hypocalcemia and ONJ; discussed not to have invasive dental procedures few weeks around the time of the injections.  Patient interested.  Will inform after insurance approval.  # DISPOSITION: # Prolia SQ in 1 week; #Follow-up in 7 months labs- cbc/cmp/Prolia SQ-Dr.B   Orders  Placed This Encounter  Procedures  . CBC with Differential/Platelet    Standing Status:   Future    Standing Expiration Date:   09/17/2019  . Comprehensive metabolic panel    Standing Status:  Future    Standing Expiration Date:   09/17/2019   All questions were answered. The patient knows to call the clinic with any problems, questions or concerns.      Cammie Sickle, MD 09/16/2018 5:31 PM

## 2018-09-16 NOTE — Assessment & Plan Note (Addendum)
#  Stage I right breast ER PR positive HER-2/neu negative breast cancer low risk mamma print; on adjuvant letrozole.  Stable.  # Clinically no evidence of recurrence; mammogram April 2019 normal limits.  #Mild to moderate arthritis-secondary letrozole; stable.  # BMD- may 2019- T=-2.5; early osteoporosis/walking; Discussed at length the risk factors [race, post-menopausal status, use of AI]; prevention/treatment strategies- including but not limited to exercise calcium and vitamin D twice a day. Discussed re: Oral biphosphonates; and side effects.  Discussed regarding Prolia/Reclast.  Discussed the potential side effects including but not limited to- hypocalcemia and ONJ; discussed not to have invasive dental procedures few weeks around the time of the injections.  Patient interested.  Will inform after insurance approval.  # DISPOSITION: # Prolia SQ in 1 week; #Follow-up in 7 months labs- cbc/cmp/Prolia SQ-Dr.B

## 2018-09-23 ENCOUNTER — Inpatient Hospital Stay: Payer: Medicare Other

## 2018-09-23 DIAGNOSIS — C50211 Malignant neoplasm of upper-inner quadrant of right female breast: Secondary | ICD-10-CM

## 2018-09-23 DIAGNOSIS — Z79811 Long term (current) use of aromatase inhibitors: Secondary | ICD-10-CM

## 2018-09-23 DIAGNOSIS — M810 Age-related osteoporosis without current pathological fracture: Secondary | ICD-10-CM

## 2018-09-23 DIAGNOSIS — Z170 Estrogen receptor positive status [ER+]: Secondary | ICD-10-CM

## 2018-09-23 MED ORDER — DENOSUMAB 60 MG/ML ~~LOC~~ SOSY
60.0000 mg | PREFILLED_SYRINGE | Freq: Once | SUBCUTANEOUS | Status: AC
  Administered 2018-09-23: 60 mg via SUBCUTANEOUS
  Filled 2018-09-23: qty 1

## 2018-12-25 ENCOUNTER — Other Ambulatory Visit: Payer: Self-pay | Admitting: Internal Medicine

## 2018-12-25 DIAGNOSIS — Z1231 Encounter for screening mammogram for malignant neoplasm of breast: Secondary | ICD-10-CM

## 2018-12-29 NOTE — Progress Notes (Signed)
Cardiology Office Note  Date:  12/30/2018   ID:  Odessie, Polzin April 19, 1933, MRN [id number]  PCP:  Idelle Crouch, MD   Chief Complaint  Patient presents with  . New Patient (Initial Visit)    Left Hand/Arm numbness. Denies SOB,Chest Pain nor dizziness.Medications reviewed verbally with patient.     HPI:  Ms. Nycole Kawahara is a 85 year old woman with past medical history of HTN Hyperlipidemia Breast cancer Reactive airway disease Former smoker, social, until age 47 Who presents by referral from Dr. Doy Hutching for left arm pain concerning for angina  Prior history reviewed with her in detail Prior treadmill stress test 01/2015  CT ABD: 02/2017 Images pulled up in the office showing mild distal aortic atherosclerosis  Meeting concern is that her left arm is having waxing waning numbness and discomfort Left arm goes to sleep, happens at nighttime, will wake her has to sit in the recliner for relief of pain Can also happen in the daytime Symptoms for the past 6 months Saw Dr. Doy Hutching Had x-ray of the neck, results unavailable but she was told there was DJD  Denies any exertional component Reports having good exercise tolerance  No shortness of breath on exertion and shortness  Recently getting over diarrhea and nausea bug  Lab work reviewed with her in detail HBA1C 6.1 Total 174, on lipitor LDL 91  EKG personally reviewed by myself on todays visit Shows normal sinus rhythm with rate 71 bpm no significant ST or T wave changes  PMH:   has a past medical history of Breast cancer ([place]) (2016), Cancer of right breast ([place]) (02/16/2015), Chronic cough, Dysrhythmia, Elevated lipase, GERD (gastroesophageal reflux disease), Heart murmur, Hyperlipidemia, Hypertension, Inflammatory arthritis, Lichen planus, Osteoarthritis, Peptic ulcer, Reactive airway disease, Seasonal allergies, Vaginal atrophy, Valvular disease, and Vasomotor instability.  PSH:    Past Surgical History:  Procedure  Laterality Date  . AXILLARY LYMPH NODE BIOPSY Right 03/19/2015   Procedure: AXILLARY LYMPH NODE BIOPSY;  Surgeon: Christene Lye, MD;  Location: [facility] ORS;  Service: General;  Laterality: Right;  . BREAST BIOPSY Right 02/16/15   POS  . BREAST EXCISIONAL BIOPSY Left ~ 2012   "non malignant"  . CARPAL TUNNEL RELEASE Right   . COLONOSCOPY    . COLONOSCOPY W/ POLYPECTOMY    . ESOPHAGOGASTRODUODENOSCOPY (EGD) WITH PROPOFOL N/A 07/28/2015   Procedure: ESOPHAGOGASTRODUODENOSCOPY (EGD) WITH PROPOFOL;  Surgeon: Manya Silvas, MD;  Location: [facility] END[facility]COPY;  Service: Endoscopy;  Laterality: N/A;  . MASTECTOMY Right 2016   BREAST CA  . ORIF HUMERUS FRACTURE Right 03/16/2016   Procedure: OPEN REDUCTION INTERNAL FIXATION (ORIF) RIGHT PROXIMAL HUMERUS FRACTURE;  Surgeon: Justice Britain, MD;  Location: [place];  Service: Orthopedics;  Laterality: Right;  . ORIF PROXIMAL HUMERUS FRACTURE Right 03/16/2016  . SENTINEL NODE BIOPSY Right 03/19/2015   Procedure: SENTINEL NODE BIOPSY;  Surgeon: Christene Lye, MD;  Location: [facility]S;  Service: General;  Laterality: Right;  . SIMPLE MASTECTOMY WITH AXILLARY SENTINEL NODE BIOPSY Right 03/19/2015   Procedure: right total MASTECTOMY with sentinel node biopsy ;  Surgeon: Christene Lye, MD;  Location: [facility]S;  Service: General;  Laterality: Right;  Marland Kitchen VAGINOPLASTY      Current Outpatient Medications  Medication Sig Dispense Refill  . aspirin 81 MG EC tablet Take 81 mg by mouth daily.      Marland Kitchen atorvastatin (LIPITOR) 20 MG tablet Take 20 mg by mouth at bedtime.     . Calcium Carbonate-Vitamin D (CALCIUM-VITAMIN  D3) 600-200 MG-UNIT TABS Take 1 tablet by mouth 2 (two) times daily.      Marland Kitchen letrozole (FEMARA) 2.5 MG tablet Take 1 tablet (2.5 mg total) by mouth daily. 90 tablet 3  . metoprolol succinate (TOPROL-XL) 25 MG 24 hr tablet Take 25 mg by mouth daily.     . Multiple Vitamins-Minerals (CENTRUM) tablet Take 1 tablet by mouth daily.      . nebivolol  (BYSTOLIC) 10 MG tablet TAKE 1 TABLET ONCE DAILY   PLEASE MAKE AN APPOINTMENT WITH YOUR DOCTOR    . pantoprazole (PROTONIX) 40 MG tablet Take 40 mg by mouth 2 (two) times daily.       No current facility-administered medications for this visit.      Allergies:   Patient has no known allergies.   Social History:  The patient  reports that she quit smoking about 52 years ago. Her smoking use included cigarettes. She has never used smokeless tobacco. She reports current alcohol use of about 7.0 standard drinks of alcohol per week. She reports that she does not use drugs.   Family History:   family history includes Atrial fibrillation in her brother; Colon cancer (age of onset: 60) in her paternal uncle; Colon cancer (age of onset: 29) in her paternal grandfather; Diabetes in her daughter and mother; Heart disease in her father and mother.    Review of Systems: Review of Systems  Constitutional: Negative.   Respiratory: Negative.   Cardiovascular: Negative.   Gastrointestinal: Negative.   Musculoskeletal: Negative.        Left arm numbness and pain at rest  Neurological: Negative.   Psychiatric/Behavioral: Negative.   All other systems reviewed and are negative.    PHYSICAL EXAM: VS:  BP 138/70 (BP Location: Right Arm, Patient Position: Sitting, Cuff Size: Normal)   Pulse 71   Ht 5\' 4"  (1.626 m)   Wt 156 lb (70.8 kg)   BMI 26.78 kg/m  , BMI Body mass index is 26.78 kg/m. GEN: Well nourished, well developed, in no acute distress  HEENT: normal  Neck: no JVD, carotid bruits, or masses Cardiac: RRR; no murmurs, rubs, or gallops,no edema  Respiratory:  clear to auscultation bilaterally, normal work of breathing GI: soft, nontender, nondistended, + BS MS: no deformity or atrophy  Skin: warm and dry, no rash Neuro:  Strength and sensation are intact Psych: euthymic mood, full affect   Recent Labs: 09/16/2018: ALT 24; BUN 18; Creatinine, Ser 0.94; Hemoglobin 13.9; Platelets 231;  Potassium 4.1; Sodium 139    Lipid Panel No results found for: CHOL, HDL, LDLCALC, TRIG    Wt Readings from Last 3 Encounters:  12/30/18 156 lb (70.8 kg)  09/16/18 160 lb 4.8 oz (72.7 kg)  02/21/18 159 lb (72.1 kg)       ASSESSMENT AND PLAN:  Left arm pain Atypical in nature likely cervical Recommended chiropractic, cortisones if indicated, physiotherapy Bed is old, she did change her pillow Less likely cardiac, good circulation, low risk factors No further testing needed CT scan images discussed with her in detail, placing her at low risk  Former smoker Stop smoking age 48, social smoker  Aortic atherosclerosis ([place]) - Plan: EKG 12-Lead Pulled up on CT scan  Mixed hyperlipidemia Numbers reasonable on Lipitor No changes made  Disposition:   F/U as needed   Total encounter time more than 60 minutes  Greater than 50% was spent in counseling and coordination of care with the patient    Orders Placed  This Encounter  Procedures  . EKG 12-Lead     Signed, Esmond Plants, M.D., Ph.D. 12/30/2018  [place], [place]

## 2018-12-30 ENCOUNTER — Ambulatory Visit (INDEPENDENT_AMBULATORY_CARE_PROVIDER_SITE_OTHER): Payer: Medicare Other | Admitting: Cardiovascular Disease

## 2018-12-30 ENCOUNTER — Encounter: Payer: Self-pay | Admitting: Cardiovascular Disease

## 2018-12-30 VITALS — BP 138/70 | HR 71 | Ht 64.0 in | Wt 156.0 lb

## 2018-12-30 DIAGNOSIS — R000 Tachycardia, unspecified: Secondary | ICD-10-CM | POA: Diagnosis not present

## 2018-12-30 DIAGNOSIS — M79602 Pain in left arm: Secondary | ICD-10-CM | POA: Diagnosis not present

## 2018-12-30 DIAGNOSIS — I700 Atherosclerosis of aorta: Secondary | ICD-10-CM | POA: Diagnosis not present

## 2018-12-30 DIAGNOSIS — Z87891 Personal history of nicotine dependence: Secondary | ICD-10-CM | POA: Diagnosis not present

## 2018-12-30 DIAGNOSIS — E782 Mixed hyperlipidemia: Secondary | ICD-10-CM

## 2018-12-30 NOTE — Patient Instructions (Signed)

## 2019-02-20 ENCOUNTER — Ambulatory Visit: Payer: Medicare Other | Admitting: General Surgery

## 2019-03-04 ENCOUNTER — Ambulatory Visit: Payer: Medicare Other | Admitting: General Surgery

## 2019-03-18 ENCOUNTER — Other Ambulatory Visit: Payer: Self-pay | Admitting: Internal Medicine

## 2019-04-10 ENCOUNTER — Other Ambulatory Visit: Payer: Self-pay

## 2019-04-10 ENCOUNTER — Ambulatory Visit
Admission: RE | Admit: 2019-04-10 | Discharge: 2019-04-10 | Disposition: A | Discharge status: Home / Self Care | Payer: Medicare Other | Source: Ambulatory Visit | Attending: Internal Medicine | Admitting: Internal Medicine

## 2019-04-10 DIAGNOSIS — Z1231 Encounter for screening mammogram for malignant neoplasm of breast: Secondary | ICD-10-CM | POA: Diagnosis present

## 2019-04-11 ENCOUNTER — Other Ambulatory Visit: Payer: Self-pay

## 2019-04-14 ENCOUNTER — Inpatient Hospital Stay (HOSPITAL_BASED_OUTPATIENT_CLINIC_OR_DEPARTMENT_OTHER): Payer: Medicare Other | Admitting: Internal Medicine

## 2019-04-14 ENCOUNTER — Encounter: Payer: Self-pay | Admitting: Internal Medicine

## 2019-04-14 ENCOUNTER — Inpatient Hospital Stay: Payer: Medicare Other

## 2019-04-14 ENCOUNTER — Inpatient Hospital Stay: Payer: Medicare Other | Attending: Internal Medicine

## 2019-04-14 ENCOUNTER — Other Ambulatory Visit: Payer: Self-pay

## 2019-04-14 ENCOUNTER — Other Ambulatory Visit: Payer: Self-pay | Admitting: *Deleted

## 2019-04-14 DIAGNOSIS — Z79899 Other long term (current) drug therapy: Secondary | ICD-10-CM | POA: Diagnosis not present

## 2019-04-14 DIAGNOSIS — C50211 Malignant neoplasm of upper-inner quadrant of right female breast: Secondary | ICD-10-CM

## 2019-04-14 DIAGNOSIS — Z170 Estrogen receptor positive status [ER+]: Secondary | ICD-10-CM

## 2019-04-14 DIAGNOSIS — Z79811 Long term (current) use of aromatase inhibitors: Secondary | ICD-10-CM | POA: Diagnosis not present

## 2019-04-14 DIAGNOSIS — M1990 Unspecified osteoarthritis, unspecified site: Secondary | ICD-10-CM | POA: Insufficient documentation

## 2019-04-14 DIAGNOSIS — M810 Age-related osteoporosis without current pathological fracture: Secondary | ICD-10-CM

## 2019-04-14 LAB — CBC WITH DIFFERENTIAL/PLATELET
Abs Immature Granulocytes: 0.03 10*3/uL (ref 0.00–0.07)
Basophils Absolute: 0 10*3/uL (ref 0.0–0.1)
Basophils Relative: 1 %
Eosinophils Absolute: 0.1 10*3/uL (ref 0.0–0.5)
Eosinophils Relative: 2 %
HCT: 44.7 % (ref 36.0–46.0)
Hemoglobin: 14.7 g/dL (ref 12.0–15.0)
Immature Granulocytes: 0 %
Lymphocytes Relative: 33 %
Lymphs Abs: 2.4 10*3/uL (ref 0.7–4.0)
MCH: 30.2 pg (ref 26.0–34.0)
MCHC: 32.9 g/dL (ref 30.0–36.0)
MCV: 92 fL (ref 80.0–100.0)
Monocytes Absolute: 0.8 10*3/uL (ref 0.1–1.0)
Monocytes Relative: 11 %
Neutro Abs: 3.9 10*3/uL (ref 1.7–7.7)
Neutrophils Relative %: 53 %
Platelets: 249 10*3/uL (ref 150–400)
RBC: 4.86 MIL/uL (ref 3.87–5.11)
RDW: 14.5 % (ref 11.5–15.5)
WBC: 7.2 10*3/uL (ref 4.0–10.5)
nRBC: 0 % (ref 0.0–0.2)

## 2019-04-14 LAB — COMPREHENSIVE METABOLIC PANEL
ALT: 32 U/L (ref 0–44)
AST: 26 U/L (ref 15–41)
Albumin: 4.5 g/dL (ref 3.5–5.0)
Alkaline Phosphatase: 80 U/L (ref 38–126)
Anion gap: 11 (ref 5–15)
BUN: 17 mg/dL (ref 8–23)
CO2: 25 mmol/L (ref 22–32)
Calcium: 9.2 mg/dL (ref 8.9–10.3)
Chloride: 104 mmol/L (ref 98–111)
Creatinine, Ser: 0.83 mg/dL (ref 0.44–1.00)
GFR calc Af Amer: >60 mL/min (ref >60)
GFR calc non Af Amer: >60 mL/min (ref >60)
Glucose, Bld: 112 mg/dL — ABNORMAL HIGH (ref 70–99)
Potassium: 4.7 mmol/L (ref 3.5–5.1)
Sodium: 140 mmol/L (ref 135–145)
Total Bilirubin: 1.5 mg/dL — ABNORMAL HIGH (ref 0.3–1.2)
Total Protein: 7.6 g/dL (ref 6.5–8.1)

## 2019-04-14 MED ORDER — DENOSUMAB 60 MG/ML ~~LOC~~ SOSY
60.0000 mg | PREFILLED_SYRINGE | Freq: Once | SUBCUTANEOUS | Status: AC
  Administered 2019-04-14: 60 mg via SUBCUTANEOUS
  Filled 2019-04-14: qty 1

## 2019-04-14 NOTE — Progress Notes (Signed)
Called patient today for Telehealth visit via [place].  Patient states no new concerns today

## 2019-04-14 NOTE — Assessment & Plan Note (Addendum)
#  Stage I right breast ER PR positive HER-2/neu negative breast cancer low risk mamma print; on adjuvant letrozole.  Stable.   # Clinically no evidence of recurrence; mammogram June 2020- normal limits.  #Mild to moderate arthritis-secondary letrozole; stable.   # BMD- may 2019- T=-2.5- continue prolia.  # DISPOSITION: #Follow-up in 6 months- MD-  cbc/cmp/[facility] SQ-Dr.B

## 2019-04-14 NOTE — Progress Notes (Signed)
I connected with [name] on 04/14/19 at  2:30 PM EDT by video enabled telemedicine visit and verified that I am speaking with the correct person using two identifiers.  I discussed the limitations, risks, security and privacy concerns of performing an evaluation and management service by telemedicine and the availability of in-person appointments. I also discussed with the patient that there may be a patient responsible charge related to this service. The patient expressed understanding and agreed to proceed.    Other persons participating in the visit and their role in the encounter: RN/medical reconciliation Patient's location: Home Provider's location: Office  Oncology History   v1.  Carcinoma of right breast 2 different reasons. 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   3.  Status post right breast mastectomy With tumor size of T1 cN0 M0.  Estrogen receptor positive.  Progesterone receptor positive.  Sentinel lymph nodes were negative.  (May, 2016) mammo print  Low risk  Patient has been started on letrozole from April 29, 2015 ----------------------------------------   DIAGNOSIS: RIGHT BREAST CA  STAGE:  I       ;GOALS: cure  CURRENT/MOST RECENT THERAPY; letrzole.       Carcinoma of upper-inner quadrant of right breast in female, estrogen receptor positive ([place])     Chief Complaint: Breast cancer   History of present illness:[name] 85 y.o.  female with history of stage I ER PR positive HER-2 negative breast cancer currently letrozole.  Patient has chronic mild joint pains.  Not any worse.  No significant hot flashes.  No lumps or bumps.   Has not had any falls.  Denies any back  pain.  Observation/objective: Hemoglobin 12.  Assessment and plan: Carcinoma of upper-inner quadrant of right breast in female, estrogen receptor positive ([place]) #Stage I right breast ER PR positive HER-2/neu negative breast cancer low risk mamma print; on adjuvant letrozole.  Stable.   # Clinically no evidence of recurrence; mammogram June 2020- normal limits.  #Mild to moderate arthritis-secondary letrozole; stable.   # BMD- may 2019- T=-2.5- continue prolia.  # DISPOSITION: #Follow-up in 6 months- MD-  cbc/cmp/[facility] SQ-Dr.B   Follow-up instructions:  I discussed the assessment and treatment plan with the patient.  The patient was provided an opportunity to ask questions and all were answered.  The patient agreed with the plan and demonstrated understanding of instructions.  The patient was advised to call back or seek an in person evaluation if the symptoms worsen or if the condition fails to improve as anticipated.  Dr. Charlaine Dalton Wagner at [facility] 04/14/2019 2:54 PM

## 2019-04-15 ENCOUNTER — Encounter: Payer: Self-pay | Admitting: General Surgery

## 2019-04-15 ENCOUNTER — Other Ambulatory Visit: Payer: Self-pay

## 2019-04-15 ENCOUNTER — Ambulatory Visit (INDEPENDENT_AMBULATORY_CARE_PROVIDER_SITE_OTHER): Payer: Medicare Other | Admitting: General Surgery

## 2019-04-15 VITALS — BP 154/80 | HR 87 | Temp 97.5°F | Resp 16 | Ht 64.0 in | Wt 155.4 lb

## 2019-04-15 DIAGNOSIS — Z170 Estrogen receptor positive status [ER+]: Secondary | ICD-10-CM

## 2019-04-15 DIAGNOSIS — C50211 Malignant neoplasm of upper-inner quadrant of right female breast: Secondary | ICD-10-CM

## 2019-04-15 NOTE — Patient Instructions (Signed)
We will reach out to you in May 2021 to schedule your left breast  Mammogram and follow up with Dr. Bary Castilla.

## 2019-04-15 NOTE — Progress Notes (Signed)
Patient ID: [name], female   DOB: 1933-03-13, 85 y.o.   MRN: [id number]  Chief Complaint  Patient presents with  . Follow-up    [place] left screening mammogram    HPI [name] is a 85 y.o. female.  Here today for left screening mammogram. Patient states she has no complaints.  HPI  Past Medical History:  Diagnosis Date  . Breast cancer ([place]) 2016   RT MASTECTOMY  . Cancer of right breast ([place]) 02/16/2015   Right breast, T1c, NO, MO; ER/PR pos. Her 2 neg, low risk Mammoprint score  . Chronic cough   . Dysrhythmia   . Elevated lipase   . GERD (gastroesophageal reflux disease)   . Heart murmur   . Hyperlipidemia    Mixed  . Hypertension    Unspecified  . Inflammatory arthritis   . Lichen planus    oral and vulvar  . Osteoarthritis    hands, cervical spine and trigger nodules  . Peptic ulcer   . Reactive airway disease   . Seasonal allergies   . Vaginal atrophy   . Valvular disease   . Vasomotor instability     Past Surgical History:  Procedure Laterality Date  . AXILLARY LYMPH NODE BIOPSY Right 03/19/2015   Procedure: AXILLARY LYMPH NODE BIOPSY;  Surgeon: Christene Lye, MD;  Location: [facility] ORS;  Service: General;  Laterality: Right;  . BREAST BIOPSY Right 02/16/15   POS  . BREAST EXCISIONAL BIOPSY Left ~ 2012   "non malignant"  . CARPAL TUNNEL RELEASE Right   . COLONOSCOPY    . COLONOSCOPY W/ POLYPECTOMY    . ESOPHAGOGASTRODUODENOSCOPY (EGD) WITH PROPOFOL N/A 07/28/2015   Procedure: ESOPHAGOGASTRODUODENOSCOPY (EGD) WITH PROPOFOL;  Surgeon: Manya Silvas, MD;  Location: [facility] ENDOSCOPY;  Service: Endoscopy;  Laterality: N/A;  . MASTECTOMY Right 2016   BREAST CA  . ORIF HUMERUS FRACTURE Right 03/16/2016   Procedure: OPEN REDUCTION INTERNAL FIXATION (ORIF) RIGHT PROXIMAL HUMERUS FRACTURE;  Surgeon: Justice Britain, MD;  Location: [place];  Service: Orthopedics;  Laterality: Right;  . ORIF PROXIMAL HUMERUS FRACTURE Right 03/16/2016  . SENTINEL NODE BIOPSY  Right 03/19/2015   Procedure: SENTINEL NODE BIOPSY;  Surgeon: Christene Lye, MD;  Location: [facility]S;  Service: General;  Laterality: Right;  . SIMPLE MASTECTOMY WITH AXILLARY SENTINEL NODE BIOPSY Right 03/19/2015   Procedure: right total MASTECTOMY with sentinel node biopsy ;  Surgeon: Christene Lye, MD;  Location: [facility] ORS;  Service: General;  Laterality: Right;  Marland Kitchen VAGINOPLASTY      Family History  Problem Relation Age of Onset  . Colon cancer Paternal Grandfather 27  . Colon cancer Paternal Uncle 12  . Heart disease Father   . Heart disease Mother   . Diabetes Mother   . Diabetes Daughter   . Atrial fibrillation Brother   . Breast cancer Neg Hx     Social History Social History   Tobacco Use  . Smoking status: Former Smoker    Types: Cigarettes    Last attempt to quit: 11/06/1966    Years since quitting: 52.4  . Smokeless tobacco: Never Used  Substance Use Topics  . Alcohol use: Yes    Alcohol/week: 7.0 standard drinks    Types: 7 Glasses of wine per week    Comment: wine at night  . Drug use: No    No Known Allergies  Current Outpatient Medications  Medication Sig Dispense Refill  . aspirin 81 MG EC tablet Take 81  mg by mouth daily.      Marland Kitchen atorvastatin (LIPITOR) 20 MG tablet Take 20 mg by mouth at bedtime.     . Calcium Carbonate-Vitamin D (CALCIUM-VITAMIN D3) 600-200 MG-UNIT TABS Take 1 tablet by mouth 2 (two) times daily.      . cetirizine (ZYRTEC) 10 MG tablet Take by mouth.    . letrozole (FEMARA) 2.5 MG tablet TAKE 1 TABLET BY MOUTH DAILY 90 tablet 3  . metoprolol succinate (TOPROL-XL) 25 MG 24 hr tablet Take 25 mg by mouth daily.     . Multiple Vitamins-Minerals (CENTRUM) tablet Take 1 tablet by mouth daily.      . nebivolol (BYSTOLIC) 10 MG tablet TAKE 1 TABLET ONCE DAILY   PLEASE MAKE AN APPOINTMENT WITH YOUR DOCTOR    . pantoprazole (PROTONIX) 40 MG tablet Take 40 mg by mouth 2 (two) times daily.       No current facility-administered  medications for this visit.     Review of Systems Review of Systems  Constitutional: Negative.   Respiratory: Negative.     Blood pressure (!) 154/80, pulse 87, temperature (!) 97.5 F (36.4 C), temperature source Temporal, resp. rate 16, height 5\' 4"  (1.626 m), weight 155 lb 6.4 oz (70.5 kg), SpO2 97 %.  Physical Exam Physical Exam Exam conducted with a chaperone present.  Constitutional:      Appearance: She is well-developed.  Eyes:     General: No scleral icterus.    Conjunctiva/sclera: Conjunctivae normal.  Neck:     Musculoskeletal: Normal range of motion.  Cardiovascular:     Rate and Rhythm: Normal rate and regular rhythm.     Heart sounds: Normal heart sounds.  Pulmonary:     Effort: Pulmonary effort is normal.     Breath sounds: Normal breath sounds.  Chest:     Breasts:        Left: No inverted nipple, mass, nipple discharge, skin change or tenderness.    Lymphadenopathy:     Cervical: No cervical adenopathy.     Upper Body:     Right upper body: No supraclavicular or axillary adenopathy.     Left upper body: No supraclavicular or axillary adenopathy.  Skin:    General: Skin is warm and dry.  Neurological:     Mental Status: She is alert and oriented to person, place, and time.     Data Reviewed April 10, 2019 left breast screening mammogram was independently reviewed.  BI-RADS-2.  Assessment No evidence of recurrent right breast cancer.  Plan We will reach out to you in May 2021 to schedule your left breast  Mammogram and follow up with Dr. Bary Castilla.   A prescription for a new lightweight breast prosthesis and surgical bras x6 was provided.  HPI, Physical Exam, Assessment and Plan have been scribed under the direction and in the presence of Samantha Bellow, MD. [name], CMA  I have completed the exam and reviewed the above documentation for accuracy and completeness.  I agree with the above.  Haematologist has been used and any errors  in dictation or transcription are unintentional.  Hervey Ard, M.D., F.A.C.S.  Forest Gleason Chirstina Haan 04/16/2019, 9:40 AM

## 2019-06-06 ENCOUNTER — Encounter: Payer: Self-pay | Admitting: General Surgery

## 2019-10-14 ENCOUNTER — Inpatient Hospital Stay: Payer: Medicare Other | Admitting: Internal Medicine

## 2019-10-14 ENCOUNTER — Inpatient Hospital Stay: Payer: Medicare Other

## 2019-10-14 ENCOUNTER — Inpatient Hospital Stay: Payer: Medicare Other | Attending: Internal Medicine

## 2019-11-10 IMAGING — MG DIGITAL SCREENING UNILATERAL LEFT MAMMOGRAM WITH CAD AND TOMO
4 series · 4 of 12 positions shown · non-contrast
Comparison: Previous exam(s).

CLINICAL DATA: Screening. History of a left breast excision in
7947, benign. History of a right mastectomy in 4133.

EXAM:
DIGITAL SCREENING UNILATERAL LEFT MAMMOGRAM WITH CAD AND TOMO

[L MLO synth-2D]
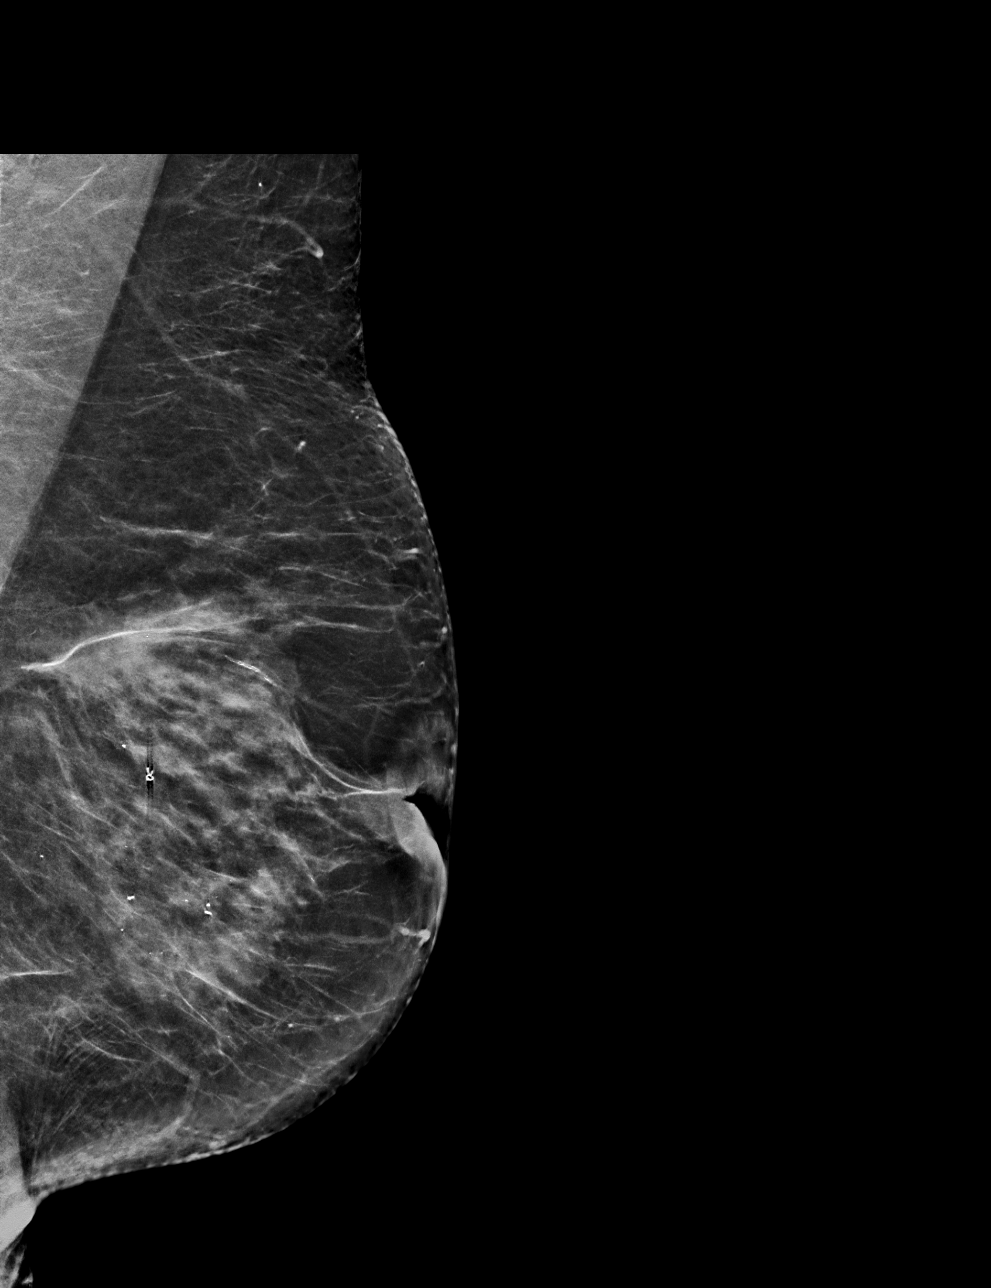

[L CC synth-2D]
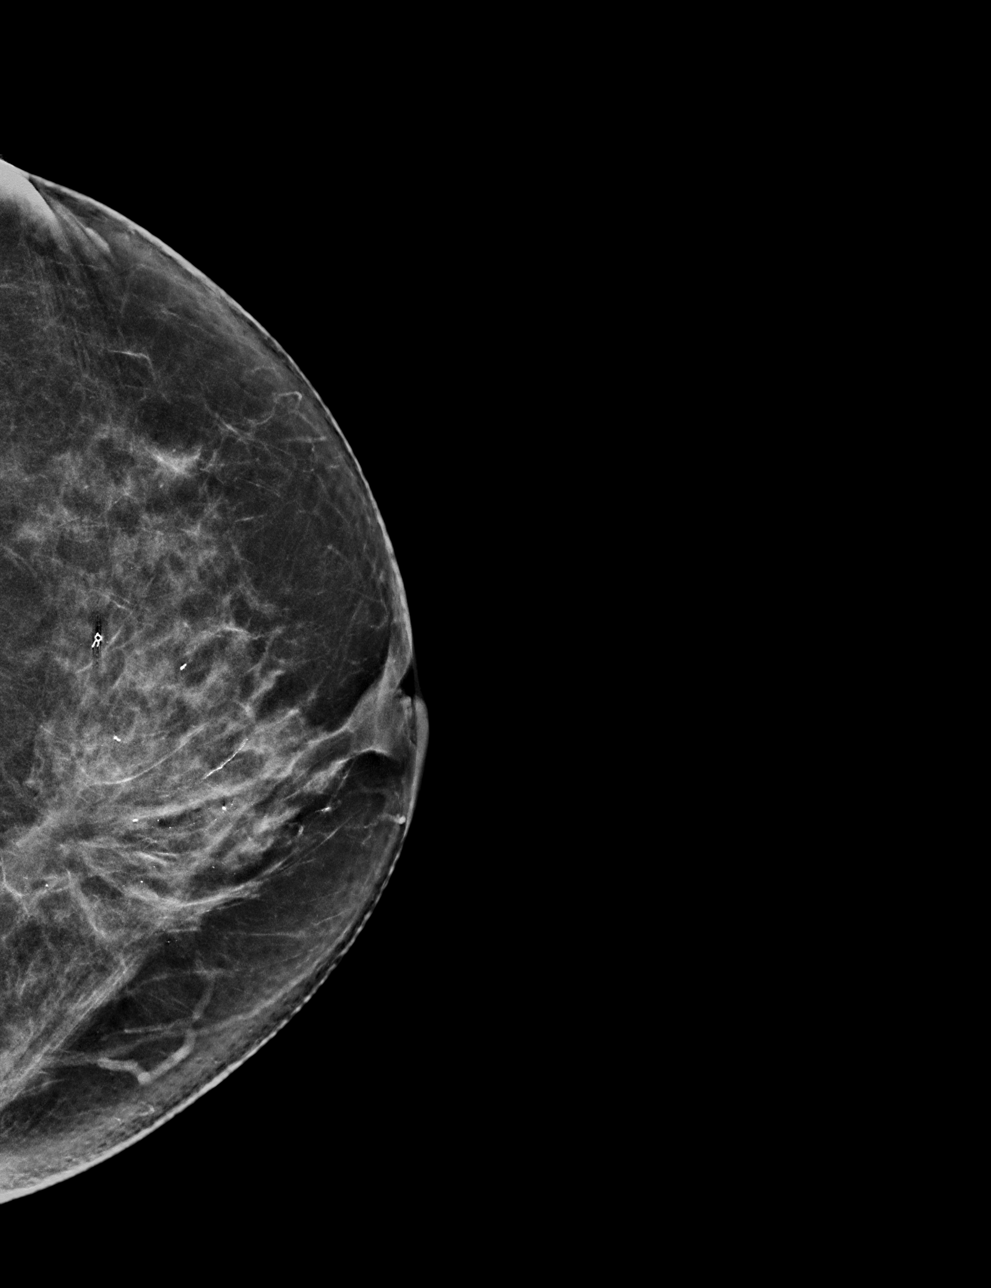

[L CC tomo · tomo slice 32/63.0]
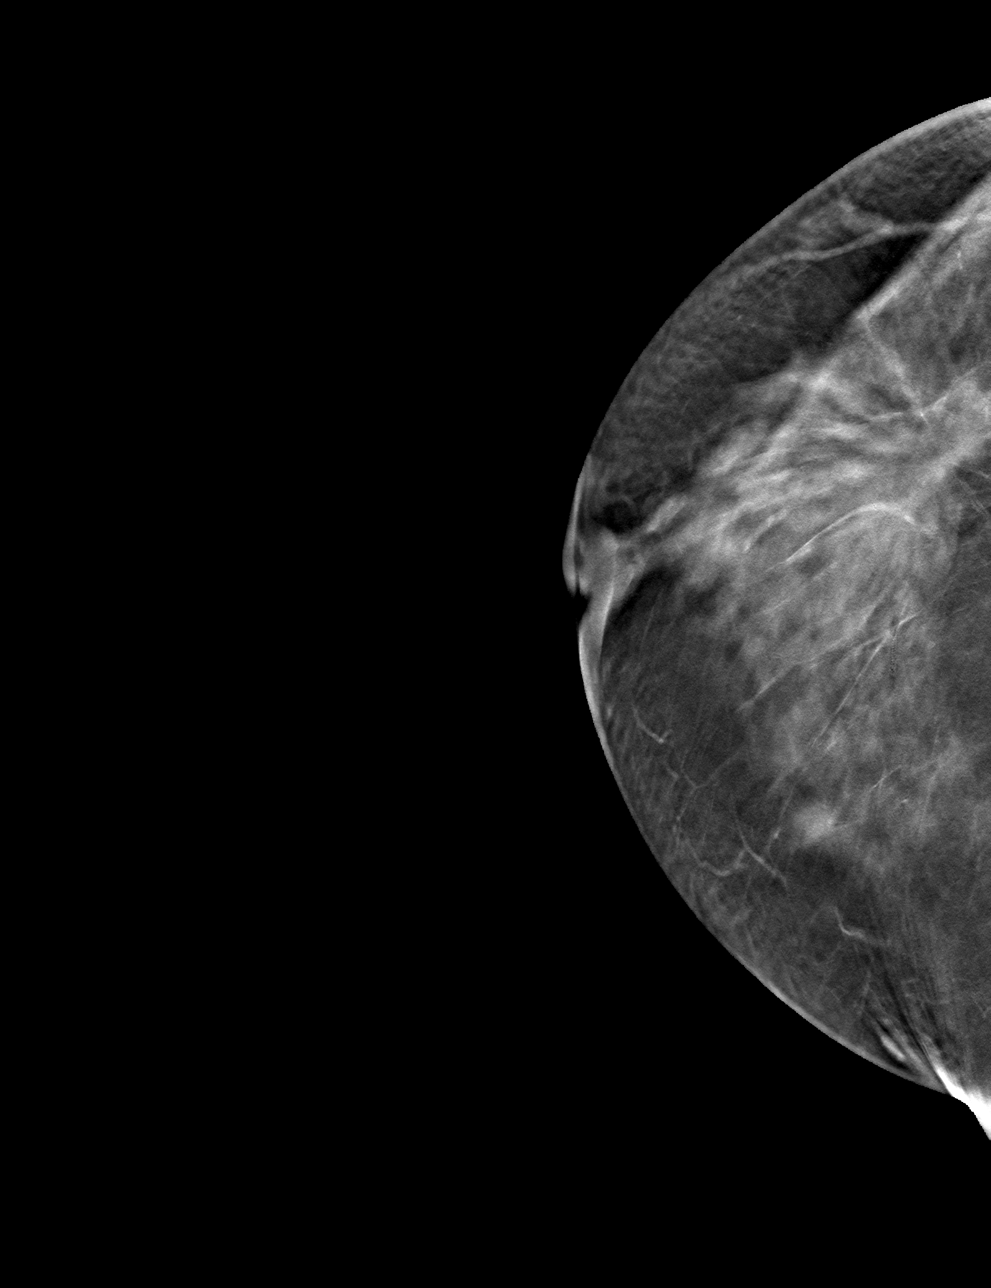

[L MLO tomo · tomo slice 35/68.0]
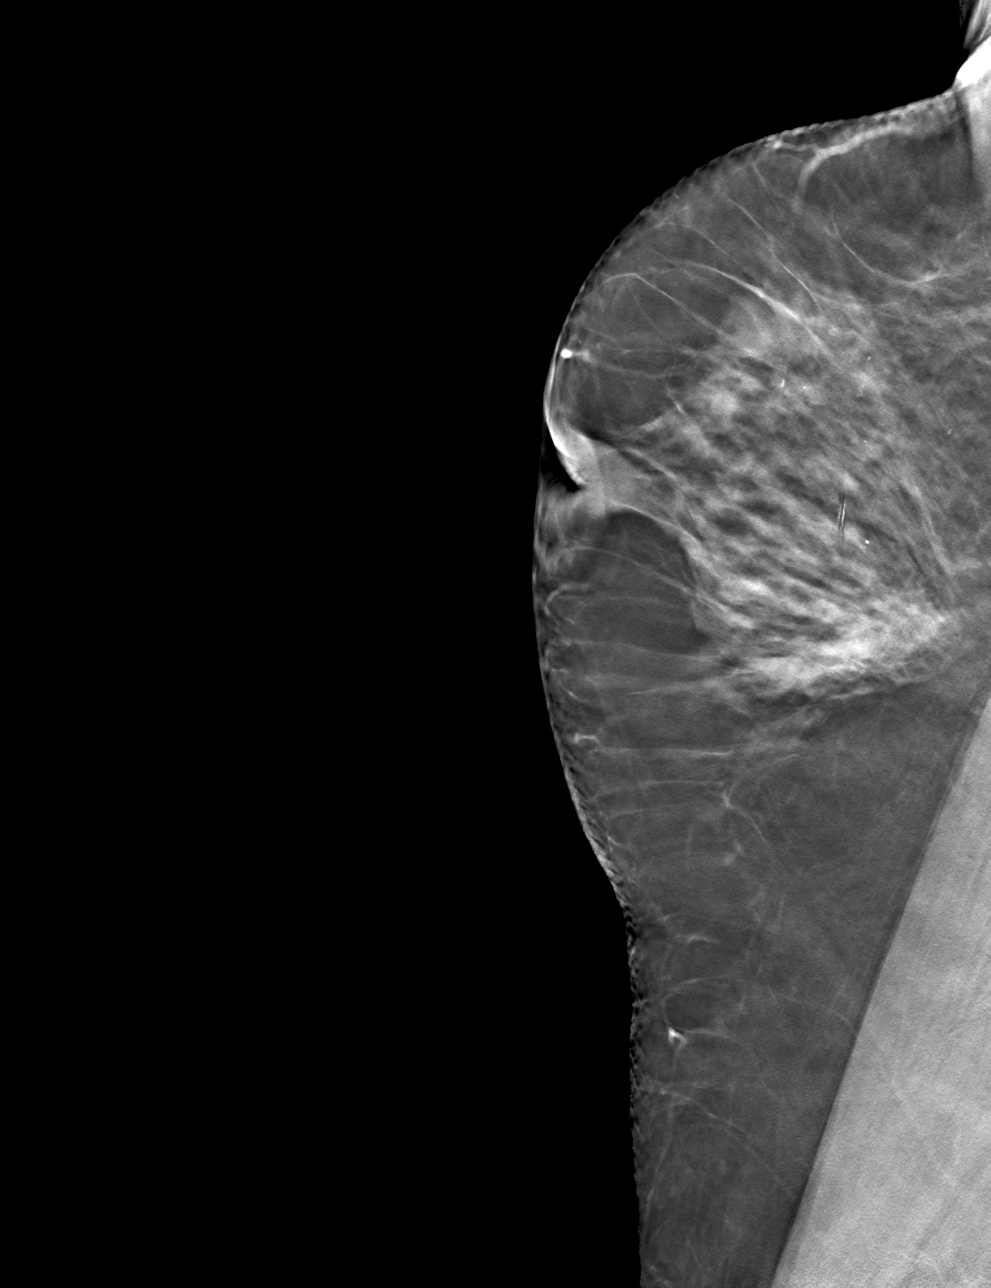

[4 of 12 positions shown; findings below may reference images not displayed]

ACR Breast Density Category c: The breast tissue is heterogeneously
dense, which may obscure small masses.
FINDINGS: There are no findings suspicious for malignancy. Stable benign
postsurgical changes. Images were processed with CAD.
IMPRESSION: No mammographic evidence of malignancy. A result letter of this
screening mammogram will be mailed directly to the patient.

RECOMMENDATION:
Screening mammogram in one year. (Code:YJ-A-LTS)

BI-RADS CATEGORY  2: Benign.

## 2020-03-08 ENCOUNTER — Other Ambulatory Visit: Payer: Self-pay | Admitting: Internal Medicine

## 2020-03-08 DIAGNOSIS — Z1231 Encounter for screening mammogram for malignant neoplasm of breast: Secondary | ICD-10-CM

## 2020-04-22 ENCOUNTER — Ambulatory Visit
Admission: RE | Admit: 2020-04-22 | Discharge: 2020-04-22 | Disposition: A | Discharge status: Home / Self Care | Payer: Medicare Other | Source: Ambulatory Visit | Attending: Internal Medicine | Admitting: Internal Medicine

## 2020-04-22 DIAGNOSIS — Z1231 Encounter for screening mammogram for malignant neoplasm of breast: Secondary | ICD-10-CM | POA: Insufficient documentation

## 2020-04-28 ENCOUNTER — Other Ambulatory Visit: Payer: Self-pay

## 2020-04-28 ENCOUNTER — Encounter: Payer: Self-pay | Admitting: Surgery

## 2020-04-28 ENCOUNTER — Ambulatory Visit (INDEPENDENT_AMBULATORY_CARE_PROVIDER_SITE_OTHER): Payer: Medicare Other | Admitting: Surgery

## 2020-04-28 VITALS — BP 156/83 | HR 74 | Temp 98.1°F | Ht 63.0 in | Wt 156.0 lb

## 2020-04-28 DIAGNOSIS — Z170 Estrogen receptor positive status [ER+]: Secondary | ICD-10-CM

## 2020-04-28 DIAGNOSIS — C50211 Malignant neoplasm of upper-inner quadrant of right female breast: Secondary | ICD-10-CM

## 2020-04-28 NOTE — Progress Notes (Signed)
04/28/2020  History of Present Illness: [name] is a 86 y.o. female s/p right mastectomy and SLNBx for breast cancer in 04/2015.  She presents today for follow up.  She had a left breast screening mammogram on 04/23/20 which was negative.  She denies any issues, particularly no masses, skin changes, bony pain.  Past Medical History: Past Medical History:  Diagnosis Date  . Breast cancer ([place]) 2016   RT MASTECTOMY  . Cancer of right breast ([place]) 02/16/2015   Right breast, T1c, NO, MO; ER/PR pos. Her 2 neg, low risk Mammoprint score  . Chronic cough   . Dysrhythmia   . Elevated lipase   . GERD (gastroesophageal reflux disease)   . Heart murmur   . Hyperlipidemia    Mixed  . Hypertension    Unspecified  . Inflammatory arthritis   . Lichen planus    oral and vulvar  . Osteoarthritis    hands, cervical spine and trigger nodules  . Peptic ulcer   . Reactive airway disease   . Seasonal allergies   . Vaginal atrophy   . Valvular disease   . Vasomotor instability      Past Surgical History: Past Surgical History:  Procedure Laterality Date  . AXILLARY LYMPH NODE BIOPSY Right 03/19/2015   Procedure: AXILLARY LYMPH NODE BIOPSY;  Surgeon: Christene Lye, MD;  Location: [facility] ORS;  Service: General;  Laterality: Right;  . BREAST BIOPSY Right 02/16/15   POS  . BREAST EXCISIONAL BIOPSY Left ~ 2012   "non malignant"  . CARPAL TUNNEL RELEASE Right   . COLONOSCOPY    . COLONOSCOPY W/ POLYPECTOMY    . ESOPHAGOGASTRODUODENOSCOPY (EGD) WITH PROPOFOL N/A 07/28/2015   Procedure: ESOPHAGOGASTRODUODENOSCOPY (EGD) WITH PROPOFOL;  Surgeon: Manya Silvas, MD;  Location: [facility] ENDOSCOPY;  Service: Endoscopy;  Laterality: N/A;  . MASTECTOMY Right 2016   BREAST CA  . ORIF HUMERUS FRACTURE Right 03/16/2016   Procedure: OPEN REDUCTION INTERNAL FIXATION (ORIF) RIGHT PROXIMAL HUMERUS FRACTURE;  Surgeon: Justice Britain, MD;  Location: [place];  Service: Orthopedics;  Laterality: Right;  . ORIF  PROXIMAL HUMERUS FRACTURE Right 03/16/2016  . SENTINEL NODE BIOPSY Right 03/19/2015   Procedure: SENTINEL NODE BIOPSY;  Surgeon: Christene Lye, MD;  Location: [facility] ORS;  Service: General;  Laterality: Right;  . SIMPLE MASTECTOMY WITH AXILLARY SENTINEL NODE BIOPSY Right 03/19/2015   Procedure: right total MASTECTOMY with sentinel node biopsy ;  Surgeon: Christene Lye, MD;  Location: [facility] OR[facility];  Service: General;  Laterality: Right;  Marland Kitchen VAGINOPLASTY      Home Medications: Prior to Admission medications   Medication Sig Start Date End Date Taking? Authorizing Provider  aspirin 81 MG EC tablet Take 81 mg by mouth daily.     Yes [provider]  atorvastatin (LIPITOR) 20 MG tablet Take 20 mg by mouth at bedtime.    Yes [provider]  Calcium Carbonate-Vitamin D (CALCIUM-VITAMIN D3) 600-200 MG-UNIT TABS Take 1 tablet by mouth 2 (two) times daily.     Yes [provider]  letrozole (FEMARA) 2.5 MG tablet TAKE 1 TABLET BY MOUTH DAILY 03/18/19  Yes Cammie Sickle, MD  metoprolol succinate (TOPROL-XL) 25 MG 24 hr tablet Take 25 mg by mouth daily.  05/22/16  Yes [provider]  Multiple Vitamins-Minerals (CENTRUM) tablet Take 1 tablet by mouth daily.     Yes [provider]  nebivolol (BYSTOLIC) 10 MG tablet TAKE 1 TABLET ONCE DAILY   PLEASE MAKE AN  APPOINTMENT WITH YOUR DOCTOR 01/14/15  Yes [provider]  pantoprazole (PROTONIX) 40 MG tablet Take 40 mg by mouth 2 (two) times daily.     Yes [provider]  cetirizine (ZYRTEC) 10 MG tablet Take by mouth. 03/06/19 03/05/20  [provider]    Allergies: No Known Allergies  Review of Systems: Review of Systems  Constitutional: Negative for chills and fever.  Respiratory: Negative for shortness of breath.   Cardiovascular: Negative for chest pain.  Gastrointestinal: Negative for nausea and vomiting.  Skin: Negative for rash.    Physical Exam BP (!) 156/83    Pulse 74   Temp 98.1 F (36.7 C) (Temporal)   Ht 5\' 3"  (1.6 m)   Wt 156 lb (70.8 kg)   SpO2 95%   BMI 27.63 kg/m  CONSTITUTIONAL: No acute distress HEENT:  Normocephalic, atraumatic, extraocular motion intact. RESPIRATORY:  Lungs are clear, and breath sounds are equal bilaterally. Normal respiratory effort without pathologic use of accessory muscles. CARDIOVASCULAR: Heart is regular without murmurs, gallops, or rubs. BREAST:  Left breast s/p prior lumpectomy in upper inner quadrant, with well healed scar.  No palpable masses, skin changes, or nipple changes.  No left axillary or supraclavicular lymphadenopathy.  Right mastectomy site with scar well healed, without any palpable masses or skin changes.  No right axillary or supraclavicular lymphadenopathy. NEUROLOGIC:  Motor and sensation is grossly normal.  Cranial nerves are grossly intact. PSYCH:  Alert and oriented to person, place and time. Affect is normal.  Labs/Imaging: Mammogram 04/23/20: IMPRESSION: No mammographic evidence of malignancy. A result letter of this screening mammogram will be mailed directly to the patient.  RECOMMENDATION: Screening mammogram in one year.  Assessment and Plan: This is a 86 y.o. female s/p right mastectomy and SLNBx in 04/2015.  --Patient is 5 years out from her cancer surgery, and she's doing very well, without any evidence of recurrence.   --Left screening mammogram negative for any suspicious findings. --Patient may now follow up with [place] as needed.  She is aware that if there are any issues or concerns, she can call us anytime so we can see her.  Otherwise, she should continue with her Oncology visits and screening mammogram with her PCP.  Face-to-face time spent with the patient and care providers was 15 minutes, with more than 50% of the time spent counseling, educating, and coordinating care of the patient.     Melvyn Neth, [facility]

## 2020-04-28 NOTE — Patient Instructions (Addendum)
Follow-up with our office as needed.  Please call and ask to speak with a nurse if you develop questions or concerns.   Breast Self-Awareness Breast self-awareness means being familiar with how your breasts look and feel. It involves checking your breasts regularly and reporting any changes to your health care provider. Practicing breast self-awareness is important. Sometimes changes may not be harmful (are benign), but sometimes a change in your breasts can be a sign of a serious medical problem. It is important to learn how to do this procedure correctly so that you can catch problems early, when treatment is more likely to be successful. All women should practice breast self-awareness, including women who have had breast implants. What you need:  A mirror.  A well-lit room. How to do a breast self-exam A breast self-exam is one way to learn what is normal for your breasts and whether your breasts are changing. To do a breast self-exam: Look for changes  1. Remove all the clothing above your waist. 2. Stand in front of a mirror in a room with good lighting. 3. Put your hands on your hips. 4. Push your hands firmly downward. 5. Compare your breasts in the mirror. Look for differences between them (asymmetry), such as: ? Differences in shape. ? Differences in size. ? Puckers, dips, and bumps in one breast and not the other. 6. Look at each breast for changes in the skin, such as: ? Redness. ? Scaly areas. 7. Look for changes in your nipples, such as: ? Discharge. ? Bleeding. ? Dimpling. ? Redness. ? A change in position. Feel for changes Carefully feel your breasts for lumps and changes. It is best to do this while lying on your back on the floor, and again while sitting or standing in the tub or shower with soapy water on your skin. Feel each breast in the following way: 1. Place the arm on the side of the breast you are examining above your head. 2. Feel your breast with the  other hand. 3. Start in the nipple area and make -inch (2 cm) overlapping circles to feel your breast. Use the pads of your three middle fingers to do this. Apply light pressure, then medium pressure, then firm pressure. The light pressure will allow you to feel the tissue closest to the skin. The medium pressure will allow you to feel the tissue that is a little deeper. The firm pressure will allow you to feel the tissue close to the ribs. 4. Continue the overlapping circles, moving downward over the breast until you feel your ribs below your breast. 5. Move one finger-width toward the center of the body. Continue to use the -inch (2 cm) overlapping circles to feel your breast as you move slowly up toward your collarbone. 6. Continue the up-and-down exam using all three pressures until you reach your armpit.  Write down what you find Writing down what you find can help you remember what to discuss with your health care provider. Write down:  What is normal for each breast.  Any changes that you find in each breast, including: ? The kind of changes you find. ? Any pain or tenderness. ? Size and location of any lumps.  Where you are in your menstrual cycle, if you are still menstruating. General tips and recommendations  Examine your breasts every month.  If you are breastfeeding, the best time to examine your breasts is after a feeding or after using a breast pump.  If you  menstruate, the best time to examine your breasts is 5-7 days after your period. Breasts are generally lumpier during menstrual periods, and it may be more difficult to notice changes.  With time and practice, you will become more familiar with the variations in your breasts and more comfortable with the exam. Contact a health care provider if you:  See a change in the shape or size of your breasts or nipples.  See a change in the skin of your breast or nipples, such as a reddened or scaly area.  Have unusual  discharge from your nipples.  Find a lump or thick area that was not there before.  Have pain in your breasts.  Have any concerns related to your breast health. Summary  Breast self-awareness includes looking for physical changes in your breasts, as well as feeling for any changes within your breasts.  Breast self-awareness should be performed in front of a mirror in a well-lit room.  You should examine your breasts every month. If you menstruate, the best time to examine your breasts is 5-7 days after your menstrual period.  Let your health care provider know of any changes you notice in your breasts, including changes in size, changes on the skin, pain or tenderness, or unusual fluid from your nipples. This information is not intended to replace advice given to you by your health care provider. Make sure you discuss any questions you have with your health care provider. Document Revised: 06/11/2018 Document Reviewed: 06/11/2018 Elsevier Patient Education  [place].

## 2020-04-30 ENCOUNTER — Other Ambulatory Visit: Payer: Self-pay | Admitting: Internal Medicine

## 2020-09-03 ENCOUNTER — Other Ambulatory Visit: Payer: Self-pay | Admitting: Internal Medicine

## 2020-09-03 ENCOUNTER — Ambulatory Visit: Payer: Medicare Other | Attending: Internal Medicine

## 2020-09-03 DIAGNOSIS — Z23 Encounter for immunization: Secondary | ICD-10-CM

## 2020-09-03 NOTE — Progress Notes (Signed)
   Covid-19 Vaccination Clinic  Name:  [name]    MRN: [id number] DOB: 12/21/1932  09/03/2020  [name] was observed post Covid-19 immunization for 15 minutes without incident. She was provided with Vaccine Information Sheet and instruction to access the V-Safe system.   [name] was instructed to call 911 with any severe reactions post vaccine: Marland Kitchen Difficulty breathing  . Swelling of face and throat  . A fast heartbeat  . A bad rash all over body  . Dizziness and weakness

## 2020-09-07 ENCOUNTER — Ambulatory Visit: Payer: Medicare Other

## 2020-09-10 ENCOUNTER — Ambulatory Visit: Payer: Medicare Other

## 2020-11-18 ENCOUNTER — Telehealth: Payer: Self-pay | Admitting: *Deleted

## 2020-11-18 NOTE — Telephone Encounter (Signed)
left msg with spouse [name]. patient not available.  Requested patient return my call to discuss change in apt times/dates.

## 2020-11-19 NOTE — Telephone Encounter (Signed)
[name]/[name]. Will you call patient to see if she is willing to move her apts out by a few weeks due to the covid surge in the county. I attempted to reach her yesterday, but was not successful in speaking to her.

## 2020-11-25 ENCOUNTER — Ambulatory Visit: Payer: Medicare Other

## 2020-11-25 ENCOUNTER — Ambulatory Visit: Payer: Medicare Other | Admitting: Internal Medicine

## 2020-11-25 ENCOUNTER — Other Ambulatory Visit: Payer: Medicare Other

## 2020-12-22 ENCOUNTER — Other Ambulatory Visit: Payer: Self-pay

## 2020-12-22 DIAGNOSIS — Z170 Estrogen receptor positive status [ER+]: Secondary | ICD-10-CM

## 2020-12-24 ENCOUNTER — Inpatient Hospital Stay (HOSPITAL_BASED_OUTPATIENT_CLINIC_OR_DEPARTMENT_OTHER): Payer: Medicare Other | Admitting: Internal Medicine

## 2020-12-24 ENCOUNTER — Inpatient Hospital Stay: Payer: Medicare Other

## 2020-12-24 ENCOUNTER — Inpatient Hospital Stay: Payer: Medicare Other | Attending: Internal Medicine

## 2020-12-24 ENCOUNTER — Encounter: Payer: Self-pay | Admitting: Internal Medicine

## 2020-12-24 DIAGNOSIS — J45909 Unspecified asthma, uncomplicated: Secondary | ICD-10-CM | POA: Insufficient documentation

## 2020-12-24 DIAGNOSIS — Z833 Family history of diabetes mellitus: Secondary | ICD-10-CM | POA: Insufficient documentation

## 2020-12-24 DIAGNOSIS — K219 Gastro-esophageal reflux disease without esophagitis: Secondary | ICD-10-CM | POA: Diagnosis not present

## 2020-12-24 DIAGNOSIS — Z8249 Family history of ischemic heart disease and other diseases of the circulatory system: Secondary | ICD-10-CM | POA: Diagnosis not present

## 2020-12-24 DIAGNOSIS — Z87891 Personal history of nicotine dependence: Secondary | ICD-10-CM | POA: Diagnosis not present

## 2020-12-24 DIAGNOSIS — Z800 Family history of malignant neoplasm of digestive organs: Secondary | ICD-10-CM | POA: Insufficient documentation

## 2020-12-24 DIAGNOSIS — Z79811 Long term (current) use of aromatase inhibitors: Secondary | ICD-10-CM | POA: Insufficient documentation

## 2020-12-24 DIAGNOSIS — Z170 Estrogen receptor positive status [ER+]: Secondary | ICD-10-CM | POA: Insufficient documentation

## 2020-12-24 DIAGNOSIS — Z7982 Long term (current) use of aspirin: Secondary | ICD-10-CM | POA: Diagnosis not present

## 2020-12-24 DIAGNOSIS — M810 Age-related osteoporosis without current pathological fracture: Secondary | ICD-10-CM

## 2020-12-24 DIAGNOSIS — Z79899 Other long term (current) drug therapy: Secondary | ICD-10-CM | POA: Insufficient documentation

## 2020-12-24 DIAGNOSIS — C50211 Malignant neoplasm of upper-inner quadrant of right female breast: Secondary | ICD-10-CM

## 2020-12-24 DIAGNOSIS — E785 Hyperlipidemia, unspecified: Secondary | ICD-10-CM | POA: Diagnosis not present

## 2020-12-24 DIAGNOSIS — I10 Essential (primary) hypertension: Secondary | ICD-10-CM | POA: Insufficient documentation

## 2020-12-24 LAB — COMPREHENSIVE METABOLIC PANEL
ALT: 22 U/L (ref 0–44)
AST: 23 U/L (ref 15–41)
Albumin: 4.2 g/dL (ref 3.5–5.0)
Alkaline Phosphatase: 98 U/L (ref 38–126)
Anion gap: 11 (ref 5–15)
BUN: 23 mg/dL (ref 8–23)
CO2: 23 mmol/L (ref 22–32)
Calcium: 9.1 mg/dL (ref 8.9–10.3)
Chloride: 103 mmol/L (ref 98–111)
Creatinine, Ser: 0.72 mg/dL (ref 0.44–1.00)
GFR, Estimated: >60 mL/min (ref >60)
Glucose, Bld: 120 mg/dL — ABNORMAL HIGH (ref 70–99)
Potassium: 4.1 mmol/L (ref 3.5–5.1)
Sodium: 137 mmol/L (ref 135–145)
Total Bilirubin: 1.3 mg/dL — ABNORMAL HIGH (ref 0.3–1.2)
Total Protein: 7.3 g/dL (ref 6.5–8.1)

## 2020-12-24 LAB — CBC WITH DIFFERENTIAL/PLATELET
Abs Immature Granulocytes: 0.02 10*3/uL (ref 0.00–0.07)
Basophils Absolute: 0 10*3/uL (ref 0.0–0.1)
Basophils Relative: 1 %
Eosinophils Absolute: 0.2 10*3/uL (ref 0.0–0.5)
Eosinophils Relative: 3 %
HCT: 42.6 % (ref 36.0–46.0)
Hemoglobin: 14.4 g/dL (ref 12.0–15.0)
Immature Granulocytes: 0 %
Lymphocytes Relative: 27 %
Lymphs Abs: 2 10*3/uL (ref 0.7–4.0)
MCH: 31 pg (ref 26.0–34.0)
MCHC: 33.8 g/dL (ref 30.0–36.0)
MCV: 91.6 fL (ref 80.0–100.0)
Monocytes Absolute: 0.8 10*3/uL (ref 0.1–1.0)
Monocytes Relative: 11 %
Neutro Abs: 4.3 10*3/uL (ref 1.7–7.7)
Neutrophils Relative %: 58 %
Platelets: 247 10*3/uL (ref 150–400)
RBC: 4.65 MIL/uL (ref 3.87–5.11)
RDW: 14.1 % (ref 11.5–15.5)
WBC: 7.3 10*3/uL (ref 4.0–10.5)
nRBC: 0 % (ref 0.0–0.2)

## 2020-12-24 MED ORDER — DENOSUMAB 60 MG/ML ~~LOC~~ SOSY
60.0000 mg | PREFILLED_SYRINGE | Freq: Once | SUBCUTANEOUS | Status: AC
  Administered 2020-12-24: 60 mg via SUBCUTANEOUS
  Filled 2020-12-24: qty 1

## 2020-12-24 NOTE — Assessment & Plan Note (Addendum)
#  Stage I right breast ER PR positive HER-2/neu negative breast cancer low risk mamma print; on adjuvant letrozole; STABLE.  Stopped letrozole summer 2021 x5 years.  # Clinically no evidence of recurrence; mammogram June 2021- normal limits.  # BMD- may 2019- T=-2.5; MAY 2021 BMD- [place]- STABLE;  continue prolia.  # DISPOSITION: # prolia today #Follow-up in 6 months- MD-  cbc/cmp/Prolia SQ-Dr.B

## 2020-12-24 NOTE — Progress Notes (Signed)
[place] OFFICE PROGRESS NOTE  Patient Care Team: Idelle Crouch, MD as PCP - General (Internal Medicine) Doy Hutching Leonie Douglas, MD (Internal Medicine) Christene Lye, MD (General Surgery)  Cancer Staging No matching staging information was found for the patient.   Oncology History Overview Note  v1.  Carcinoma of right breast 2 different reasons. 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   3.  Status post right breast mastectomy With tumor size of T1 cN0 M0.  Estrogen receptor positive.  Progesterone receptor positive.  Sentinel lymph nodes were negative.  (May, 2016) mammo print  Low risk  Patient has been started on letrozole from April 29, 2015; STOPPED SUMMER 2021- x5 years.  ----------------------------------------   DIAGNOSIS: RIGHT BREAST CA  STAGE:  I       ;GOALS: cure  CURRENT/MOST RECENT THERAPY; letrzole.     Carcinoma of upper-inner quadrant of right breast in female, estrogen receptor positive ([place])      INTERVAL HISTORY:  [name] 87 y.o.  female pleasant patient above history of upper history of stage I ER PR positive breast cancer currently on letrozole is here for follow-up.  Patient stopping letrozole few months ago.  Patient denies any new lumps or bumps.  No headaches.  No nausea vomiting.  No bone pain.  Review of Systems  Constitutional: Negative for chills, diaphoresis, fever, malaise/fatigue and weight loss.  HENT: Negative for nosebleeds and sore throat.   Eyes: Negative for double vision.  Respiratory: Negative for cough, hemoptysis, sputum production, shortness of breath and wheezing.   Cardiovascular: Negative for chest pain, palpitations, orthopnea and leg  swelling.  Gastrointestinal: Negative for abdominal pain, blood in stool, constipation, diarrhea, heartburn, melena, nausea and vomiting.  Genitourinary: Negative for dysuria, frequency and urgency.  Musculoskeletal: Positive for joint pain. Negative for back pain.  Skin: Negative.  Negative for itching and rash.  Neurological: Negative for dizziness, tingling, focal weakness, weakness and headaches.  Endo/Heme/Allergies: Does not bruise/bleed easily.  Psychiatric/Behavioral: Negative for depression. The patient is not nervous/anxious and does not have insomnia.       PAST MEDICAL HISTORY :  Past Medical History:  Diagnosis Date  . Breast cancer ([place]) 2016   RT MASTECTOMY  . Cancer of right breast ([place]) 02/16/2015   Right breast, T1c, NO, MO; ER/PR pos. Her 2 neg, low risk Mammoprint score  . Chronic cough   . Dysrhythmia   . Elevated lipase   . GERD (gastroesophageal reflux disease)   . Heart murmur   . Hyperlipidemia    Mixed  . Hypertension    Unspecified  . Inflammatory arthritis   . Lichen planus    oral and vulvar  . Osteoarthritis    hands, cervical spine and trigger nodules  . Peptic ulcer   . Reactive airway disease   . Seasonal allergies   . Vaginal atrophy   . Valvular disease   . Vasomotor instability     PAST SURGICAL HISTORY :   Past Surgical History:  Procedure Laterality Date  . AXILLARY LYMPH NODE BIOPSY Right 03/19/2015   Procedure: AXILLARY LYMPH NODE BIOPSY;  Surgeon: Christene Lye, MD;  Location: [facility] ORS;  Service: General;  Laterality: Right;  . BREAST BIOPSY Right 02/16/15   POS  . BREAST  EXCISIONAL BIOPSY Left ~ 2012   "non malignant"  . CARPAL TUNNEL RELEASE Right   . COLONOSCOPY    . COLONOSCOPY W/ POLYPECTOMY    . ESOPHAGOGASTRODUODENOSCOPY (EGD) WITH PROPOFOL N/A 07/28/2015   Procedure: ESOPHAGOGASTRODUODENOSCOPY (EGD) WITH PROPOFOL;  Surgeon: Manya Silvas, MD;  Location[facility];  Service: Endoscopy;  Laterality: N/A;   . MASTECTOMY Right 2016   BREAST CA  . ORIF HUMERUS FRACTURE Right 03/16/2016   Procedure: OPEN REDUCTION INTERNAL FIXATION (ORIF) RIGHT PROXIMAL HUMERUS FRACTURE;  Surgeon: Justice Britain, MD;  Location: [facility];  Service: Orthopedics;  Laterality: Right;  . ORIF PROXIMAL HUMERUS FRACTURE Right 03/16/2016  . SENTINEL NODE BIOPSY Right 03/19/2015   Procedure: SENTINEL NODE BIOPSY;  Surgeon: Christene Lye, MD;  Location: [facility] ORS;  Service: General;  Laterality: Right;  . SIMPLE MASTECTOMY WITH AXILLARY SENTINEL NODE BIOPSY Right 03/19/2015   Procedure: right total MASTECTOMY with sentinel node biopsy ;  Surgeon: Christene Lye, MD;  Location: [facility] ORS;  Service: General;  Laterality: Right;  Marland Kitchen VAGINOPLASTY      FAMILY HISTORY :   Family History  Problem Relation Age of Onset  . Colon cancer Paternal Grandfather 37  . Colon cancer Paternal Uncle 61  . Heart disease Father   . Heart disease Mother   . Diabetes Mother   . Diabetes Daughter   . Atrial fibrillation Brother   . Breast cancer Neg Hx     SOCIAL HISTORY:   Social History   Tobacco Use  . Smoking status: Former Smoker    Types: Cigarettes    Quit date: 11/06/1966    Years since quitting: 54.1  . Smokeless tobacco: Never Used  Substance Use Topics  . Alcohol use: Yes    Alcohol/week: 7.0 standard drinks    Types: 7 Glasses of wine per week    Comment: wine at night  . Drug use: No    ALLERGIES:  has No Known Allergies.  MEDICATIONS:  Current Outpatient Medications  Medication Sig Dispense Refill  . aspirin 81 MG EC tablet Take 81 mg by mouth daily.    Marland Kitchen atorvastatin (LIPITOR) 20 MG tablet Take 20 mg by mouth at bedtime.    . Calcium Carbonate-Vitamin D (CALCIUM-VITAMIN D3) 600-200 MG-UNIT TABS Take 1 tablet by mouth 2 (two) times daily.    Marland Kitchen letrozole (FEMARA) 2.5 MG tablet TAKE 1 TABLET BY MOUTH DAILY 90 tablet 3  . metoprolol succinate (TOPROL-XL) 25 MG 24 hr tablet Take 25 mg by mouth daily.     .  Multiple Vitamins-Minerals (CENTRUM) tablet Take 1 tablet by mouth daily.    . nebivolol (BYSTOLIC) 10 MG tablet TAKE 1 TABLET ONCE DAILY   PLEASE MAKE AN APPOINTMENT WITH YOUR DOCTOR     No current facility-administered medications for this visit.    PHYSICAL EXAMINATION: ECOG PERFORMANCE STATUS: 0 - Asymptomatic  BP 134/62 (BP Location: Left Arm, Patient Position: Sitting, Cuff Size: Normal)   Pulse 63   Temp 98.7 F (37.1 C) (Tympanic)   Resp 16   Ht '5\' 3"'  (1.6 m)   Wt 152 lb 9.6 oz (69.2 kg)   SpO2 98%   BMI 27.03 kg/m   Filed Weights   12/24/20 0938  Weight: 152 lb 9.6 oz (69.2 kg)   Physical Exam HENT:     Head: Normocephalic and atraumatic.     Mouth/Throat:     Pharynx: No oropharyngeal exudate.  Eyes:     Pupils: Pupils are equal,  round, and reactive to light.  Cardiovascular:     Rate and Rhythm: Normal rate and regular rhythm.  Pulmonary:     Effort: No respiratory distress.     Breath sounds: No wheezing.  Abdominal:     General: Bowel sounds are normal. There is no distension.     Palpations: Abdomen is soft. There is no mass.     Tenderness: There is no abdominal tenderness. There is no guarding or rebound.  Musculoskeletal:        General: No tenderness. Normal range of motion.     Cervical back: Normal range of motion and neck supple.  Skin:    General: Skin is warm.  Neurological:     Mental Status: She is alert and oriented to person, place, and time.  Psychiatric:        Mood and Affect: Affect normal.        LABORATORY DATA:  I have reviewed the data as listed    Component Value Date/Time   NA 137 12/24/2020 0925   K 4.1 12/24/2020 0925   CL 103 12/24/2020 0925   CO2 23 12/24/2020 0925   GLUCOSE 120 (H) 12/24/2020 0925   BUN 23 12/24/2020 0925   CREATININE 0.72 12/24/2020 0925   CALCIUM 9.1 12/24/2020 0925   PROT 7.3 12/24/2020 0925   ALBUMIN 4.2 12/24/2020 0925   AST 23 12/24/2020 0925   ALT 22 12/24/2020 0925   ALKPHOS 98  12/24/2020 0925   BILITOT 1.3 (H) 12/24/2020 0925   GFRNONAA >60 12/24/2020 0925   GFRAA >60 04/14/2019 1310    No results found for: SPEP, UPEP  Lab Results  Component Value Date   WBC 7.3 12/24/2020   NEUTROABS 4.3 12/24/2020   HGB 14.4 12/24/2020   HCT 42.6 12/24/2020   MCV 91.6 12/24/2020   PLT 247 12/24/2020      Chemistry      Component Value Date/Time   NA 137 12/24/2020 0925   K 4.1 12/24/2020 0925   CL 103 12/24/2020 0925   CO2 23 12/24/2020 0925   BUN 23 12/24/2020 0925   CREATININE 0.72 12/24/2020 0925      Component Value Date/Time   CALCIUM 9.1 12/24/2020 0925   ALKPHOS 98 12/24/2020 0925   AST 23 12/24/2020 0925   ALT 22 12/24/2020 0925   BILITOT 1.3 (H) 12/24/2020 0925       RADIOGRAPHIC STUDIES: I have personally reviewed the radiological images as listed and agreed with the findings in the report. No results found.   ASSESSMENT & PLAN:  Carcinoma of upper-inner quadrant of right breast in female, estrogen receptor positive ([place]) #Stage I right breast ER PR positive HER-2/neu negative breast cancer low risk mamma print; on adjuvant letrozole; STABLE.  Stopped letrozole summer 2021 x5 years.  # Clinically no evidence of recurrence; mammogram June 2021- normal limits.  # BMD- may 2019- T=-2.5; MAY 2021 BMD- [place]- STABLE;  continue prolia.  # DISPOSITION: # prolia today #Follow-up in 6 months- MD-  cbc/cmp/Prolia SQ-Dr.B   Orders Placed This Encounter  Procedures  . CBC with Differential/Platelet    Standing Status:   Future    Standing Expiration Date:   12/24/2021  . Comprehensive metabolic panel    Standing Status:   Future    Standing Expiration Date:   12/24/2021   All questions were answered. The patient knows to call the clinic with any problems, questions or concerns.      Cammie Sickle, MD  12/24/2020 12:49 PM

## 2021-03-18 ENCOUNTER — Other Ambulatory Visit: Payer: Self-pay | Admitting: General Surgery

## 2021-03-18 DIAGNOSIS — C50211 Malignant neoplasm of upper-inner quadrant of right female breast: Secondary | ICD-10-CM

## 2021-03-18 DIAGNOSIS — Z1231 Encounter for screening mammogram for malignant neoplasm of breast: Secondary | ICD-10-CM

## 2021-04-25 ENCOUNTER — Other Ambulatory Visit: Payer: Self-pay

## 2021-04-25 ENCOUNTER — Ambulatory Visit
Admission: RE | Admit: 2021-04-25 | Discharge: 2021-04-25 | Disposition: A | Discharge status: Home / Self Care | Payer: Medicare Other | Source: Ambulatory Visit | Attending: General Surgery | Admitting: General Surgery

## 2021-04-25 DIAGNOSIS — Z1231 Encounter for screening mammogram for malignant neoplasm of breast: Secondary | ICD-10-CM | POA: Diagnosis not present

## 2021-06-24 ENCOUNTER — Inpatient Hospital Stay: Payer: Medicare Other | Attending: Internal Medicine

## 2021-06-24 ENCOUNTER — Inpatient Hospital Stay: Payer: Medicare Other

## 2021-06-24 ENCOUNTER — Inpatient Hospital Stay (HOSPITAL_BASED_OUTPATIENT_CLINIC_OR_DEPARTMENT_OTHER): Payer: Medicare Other | Admitting: Internal Medicine

## 2021-06-24 ENCOUNTER — Other Ambulatory Visit: Payer: Self-pay

## 2021-06-24 DIAGNOSIS — M810 Age-related osteoporosis without current pathological fracture: Secondary | ICD-10-CM | POA: Insufficient documentation

## 2021-06-24 DIAGNOSIS — C50211 Malignant neoplasm of upper-inner quadrant of right female breast: Secondary | ICD-10-CM

## 2021-06-24 DIAGNOSIS — Z170 Estrogen receptor positive status [ER+]: Secondary | ICD-10-CM

## 2021-06-24 DIAGNOSIS — Z853 Personal history of malignant neoplasm of breast: Secondary | ICD-10-CM | POA: Diagnosis not present

## 2021-06-24 DIAGNOSIS — Z79811 Long term (current) use of aromatase inhibitors: Secondary | ICD-10-CM

## 2021-06-24 LAB — CBC WITH DIFFERENTIAL/PLATELET
Abs Immature Granulocytes: 0.06 10*3/uL (ref 0.00–0.07)
Basophils Absolute: 0 10*3/uL (ref 0.0–0.1)
Basophils Relative: 1 %
Eosinophils Absolute: 0.2 10*3/uL (ref 0.0–0.5)
Eosinophils Relative: 3 %
HCT: 43.1 % (ref 36.0–46.0)
Hemoglobin: 14.6 g/dL (ref 12.0–15.0)
Immature Granulocytes: 1 %
Lymphocytes Relative: 29 %
Lymphs Abs: 1.8 10*3/uL (ref 0.7–4.0)
MCH: 31.7 pg (ref 26.0–34.0)
MCHC: 33.9 g/dL (ref 30.0–36.0)
MCV: 93.5 fL (ref 80.0–100.0)
Monocytes Absolute: 0.8 10*3/uL (ref 0.1–1.0)
Monocytes Relative: 13 %
Neutro Abs: 3.3 10*3/uL (ref 1.7–7.7)
Neutrophils Relative %: 53 %
Platelets: 233 10*3/uL (ref 150–400)
RBC: 4.61 MIL/uL (ref 3.87–5.11)
RDW: 14.4 % (ref 11.5–15.5)
WBC: 6.1 10*3/uL (ref 4.0–10.5)
nRBC: 0 % (ref 0.0–0.2)

## 2021-06-24 LAB — COMPREHENSIVE METABOLIC PANEL
ALT: 24 U/L (ref 0–44)
AST: 27 U/L (ref 15–41)
Albumin: 4.4 g/dL (ref 3.5–5.0)
Alkaline Phosphatase: 84 U/L (ref 38–126)
Anion gap: 9 (ref 5–15)
BUN: 18 mg/dL (ref 8–23)
CO2: 28 mmol/L (ref 22–32)
Calcium: 9.7 mg/dL (ref 8.9–10.3)
Chloride: 102 mmol/L (ref 98–111)
Creatinine, Ser: 0.88 mg/dL (ref 0.44–1.00)
GFR, Estimated: >60 mL/min (ref >60)
Glucose, Bld: 120 mg/dL — ABNORMAL HIGH (ref 70–99)
Potassium: 4.9 mmol/L (ref 3.5–5.1)
Sodium: 139 mmol/L (ref 135–145)
Total Bilirubin: 1.6 mg/dL — ABNORMAL HIGH (ref 0.3–1.2)
Total Protein: 7.4 g/dL (ref 6.5–8.1)

## 2021-06-24 MED ORDER — DENOSUMAB 60 MG/ML ~~LOC~~ SOSY
60.0000 mg | PREFILLED_SYRINGE | Freq: Once | SUBCUTANEOUS | Status: AC
  Administered 2021-06-24: 60 mg via SUBCUTANEOUS
  Filled 2021-06-24: qty 1

## 2021-06-24 NOTE — Progress Notes (Signed)
[place] OFFICE PROGRESS NOTE  Patient Care Team: Idelle Crouch, MD as PCP - General (Internal Medicine) Doy Hutching Leonie Douglas, MD (Internal Medicine) Christene Lye, MD (General Surgery)  Cancer Staging No matching staging information was found for the patient.   Oncology History Overview Note  v1.  Carcinoma of right breast 2 different reasons. 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   3.  Status post right breast mastectomy With tumor size of T1 cN0 M0.  Estrogen receptor positive.  Progesterone receptor positive.  Sentinel lymph nodes were negative.  (May, 2016) mammo print  Low risk  Patient has been started on letrozole from April 29, 2015; STOPPED SUMMER 2021- x5 years.  ----------------------------------------   DIAGNOSIS: RIGHT BREAST CA  STAGE:  I       ;GOALS: cure  CURRENT/MOST RECENT THERAPY; letrzole.     Carcinoma of upper-inner quadrant of right breast in female, estrogen receptor positive ([place])      INTERVAL HISTORY:  [name] 87 y.o.  female pleasant patient above history of upper history of stage I ER PR positive breast cancer currently on surveillance is here for follow-up/Prolia for osteoporosis.  Patient denies any new lumps or bumps.  Appetite is good with no weight loss.  She has good energy.  She has been taking care of her husband who has back pain/compression fractures.   Review of Systems  Constitutional:  Negative for chills, diaphoresis, fever, malaise/fatigue and weight loss.  HENT:  Negative for nosebleeds and sore throat.   Eyes:  Negative for double vision.  Respiratory:  Negative for cough, hemoptysis, sputum production, shortness of breath and  wheezing.   Cardiovascular:  Negative for chest pain, palpitations, orthopnea and leg swelling.  Gastrointestinal:  Negative for abdominal pain, blood in stool, constipation, diarrhea, heartburn, melena, nausea and vomiting.  Genitourinary:  Negative for dysuria, frequency and urgency.  Musculoskeletal:  Positive for joint pain. Negative for back pain.  Skin: Negative.  Negative for itching and rash.  Neurological:  Negative for dizziness, tingling, focal weakness, weakness and headaches.  Endo/Heme/Allergies:  Does not bruise/bleed easily.  Psychiatric/Behavioral:  Negative for depression. The patient is not nervous/anxious and does not have insomnia.      PAST MEDICAL HISTORY :  Past Medical History:  Diagnosis Date   Breast cancer ([place]) 2016   RT MASTECTOMY   Cancer of right breast ([place]) 02/16/2015   Right breast, T1c, NO, MO; ER/PR pos. Her 2 neg, low risk Mammoprint score   Chronic cough    Dysrhythmia    Elevated lipase    GERD (gastroesophageal reflux disease)    Heart murmur    Hyperlipidemia    Mixed   Hypertension    Unspecified   Inflammatory arthritis    Lichen planus    oral and vulvar   Osteoarthritis    hands, cervical spine and trigger nodules   Peptic ulcer    Reactive airway disease    Seasonal allergies    Vaginal atrophy    Valvular disease    Vasomotor instability     PAST SURGICAL HISTORY :   Past Surgical History:  Procedure Laterality Date   AXILLARY LYMPH NODE BIOPSY Right 03/19/2015   Procedure: AXILLARY LYMPH NODE BIOPSY;  Surgeon: Christene Lye, MD;  Location: [facility] ORS;  Service: General;  Laterality: Right;   BREAST BIOPSY Right 02/16/15   POS   BREAST EXCISIONAL BIOPSY Left ~ 2012   "non malignant"   CARPAL TUNNEL RELEASE Right    COLONOSCOPY     COLONOSCOPY W/ POLYPECTOMY     ESOPHAGOGASTRODUODENOSCOPY (EGD) WITH PROPOFOL N/A 07/28/2015   Procedure: ESOPHAGOGASTRODUODENOSCOPY (EGD) WITH PROPOFOL;  Surgeon: Manya Silvas, MD;   Location: [facility] ENDOSCOPY;  Service: Endoscopy;  Laterality: N/A;   MASTECTOMY Right 2016   BREAST CA   ORIF HUMERUS FRACTURE Right 03/16/2016   Procedure: OPEN REDUCTION INTERNAL FIXATION (ORIF) RIGHT PROXIMAL HUMERUS FRACTURE;  Surgeon: Justice Britain, MD;  Location: [place];  Service: Orthopedics;  Laterality: Right;   ORIF PROXIMAL HUMERUS FRACTURE Right 03/16/2016   SENTINEL NODE BIOPSY Right 03/19/2015   Procedure: SENTINEL NODE BIOPSY;  Surgeon: Christene Lye, MD;  Location: [facility];  Service: General;  Laterality: Right;   SIMPLE MASTECTOMY WITH AXILLARY SENTINEL NODE BIOPSY Right 03/19/2015   Procedure: right total MASTECTOMY with sentinel node biopsy ;  Surgeon: Christene Lye, MD;  Location: [facility];  Service: General;  Laterality: Right;   VAGINOPLASTY      FAMILY HISTORY :   Family History  Problem Relation Age of Onset   Colon cancer Paternal Grandfather 32   Colon cancer Paternal Uncle 49   Heart disease Father    Heart disease Mother    Diabetes Mother    Diabetes Daughter    Atrial fibrillation Brother    Breast cancer Neg Hx     SOCIAL HISTORY:   Social History   Tobacco Use   Smoking status: Former    Types: Cigarettes    Quit date: 11/06/1966    Years since quitting: 54.6   Smokeless tobacco: Never  Substance Use Topics   Alcohol use: Yes    Alcohol/week: 7.0 standard drinks    Types: 7 Glasses of wine per week    Comment: wine at night   Drug use: No    ALLERGIES:  has No Known Allergies.  MEDICATIONS:  Current Outpatient Medications  Medication Sig Dispense Refill   aspirin 81 MG EC tablet Take 81 mg by mouth daily.     atorvastatin (LIPITOR) 20 MG tablet Take 20 mg by mouth at bedtime.     Calcium Carbonate-Vitamin D (CALCIUM-VITAMIN D3) 600-200 MG-UNIT TABS Take 1 tablet by mouth 2 (two) times daily.     metoprolol succinate (TOPROL-XL) 25 MG 24 hr tablet Take 25 mg by mouth daily.      Multiple Vitamins-Minerals (CENTRUM) tablet Take  1 tablet by mouth daily.     nebivolol (BYSTOLIC) 10 MG tablet TAKE 1 TABLET ONCE DAILY   PLEASE MAKE AN APPOINTMENT WITH YOUR DOCTOR     No current facility-administered medications for this visit.    PHYSICAL EXAMINATION: ECOG PERFORMANCE STATUS: 0 - Asymptomatic  BP 129/69   Pulse 68   Temp 98.2 F (36.8 C) (Tympanic)   Resp 20   There were no vitals filed for this visit.  Physical Exam HENT:     Head: Normocephalic and atraumatic.     Mouth/Throat:     Pharynx: No oropharyngeal exudate.  Eyes:     Pupils: Pupils are equal, round, and reactive to light.  Cardiovascular:     Rate and Rhythm: Normal rate and regular rhythm.  Pulmonary:     Effort: No respiratory distress.     Breath sounds: No wheezing.  Abdominal:  General: Bowel sounds are normal. There is no distension.     Palpations: Abdomen is soft. There is no mass.     Tenderness: There is no abdominal tenderness. There is no guarding or rebound.  Musculoskeletal:        General: No tenderness. Normal range of motion.     Cervical back: Normal range of motion and neck supple.  Skin:    General: Skin is warm.  Neurological:     Mental Status: She is alert and oriented to person, place, and time.  Psychiatric:        Mood and Affect: Affect normal.       LABORATORY DATA:  I have reviewed the data as listed    Component Value Date/Time   NA 139 06/24/2021 0932   K 4.9 06/24/2021 0932   CL 102 06/24/2021 0932   CO2 28 06/24/2021 0932   GLUCOSE 120 (H) 06/24/2021 0932   BUN 18 06/24/2021 0932   CREATININE 0.88 06/24/2021 0932   CALCIUM 9.7 06/24/2021 0932   PROT 7.4 06/24/2021 0932   ALBUMIN 4.4 06/24/2021 0932   AST 27 06/24/2021 0932   ALT 24 06/24/2021 0932   ALKPHOS 84 06/24/2021 0932   BILITOT 1.6 (H) 06/24/2021 0932   GFRNONAA >60 06/24/2021 0932   GFRAA >60 04/14/2019 1310    No results found for: SPEP, UPEP  Lab Results  Component Value Date   WBC 6.1 06/24/2021   NEUTROABS 3.3  06/24/2021   HGB 14.6 06/24/2021   HCT 43.1 06/24/2021   MCV 93.5 06/24/2021   PLT 233 06/24/2021      Chemistry      Component Value Date/Time   NA 139 06/24/2021 0932   K 4.9 06/24/2021 0932   CL 102 06/24/2021 0932   CO2 28 06/24/2021 0932   BUN 18 06/24/2021 0932   CREATININE 0.88 06/24/2021 0932      Component Value Date/Time   CALCIUM 9.7 06/24/2021 0932   ALKPHOS 84 06/24/2021 0932   AST 27 06/24/2021 0932   ALT 24 06/24/2021 0932   BILITOT 1.6 (H) 06/24/2021 0932       RADIOGRAPHIC STUDIES: I have personally reviewed the radiological images as listed and agreed with the findings in the report. No results found.   ASSESSMENT & PLAN:  Carcinoma of upper-inner quadrant of right breast in female, estrogen receptor positive ([place]) #Stage I right breast ER PR positive HER-2/neu negative breast cancer low risk mamma print; on adjuvant letrozole; STABLE.  Stopped letrozole summer 2021 x5 years.  # Clinically no evidence of recurrence; mammogram June 2022 [Dr.Byrnett]- normal limits.  # BMD- may 2021- T=-2.5; MAY 2021 BMD- [place]- STABLE. continue prolia. Again will need in 1-2 year.   # #Since patient is clinically stable I think is reasonable for the patient to follow-up with PCP/can follow-up with [place] as needed.  I would defer further bone density follow-up/Prolia to Dr. Doy Hutching.  Patient comfortable with the plan; to call us if any questions or concerns in the interim.   # DISPOSITION: # prolia today #Follow-up as needed- Dr.B  Dr.Sparks/Dr.Byrnett   No orders of the defined types were placed in this encounter.  All questions were answered. The patient knows to call the clinic with any problems, questions or concerns.      Cammie Sickle, MD 06/24/2021 11:02 AM

## 2021-06-24 NOTE — Assessment & Plan Note (Addendum)
#  Stage I right breast ER PR positive HER-2/neu negative breast cancer low risk mamma print; on adjuvant letrozole; STABLE.  Stopped letrozole summer 2021 x5 years.  # Clinically no evidence of recurrence; mammogram June 2022 [Dr.Byrnett]- normal limits.  # BMD- may 2021- T=-2.5; MAY 2021 BMD- [place]- STABLE. continue prolia. Again will need in 1-2 year.   # #Since patient is clinically stable I think is reasonable for the patient to follow-up with PCP/can follow-up with [place] as needed.  I would defer further bone density follow-up/Prolia to Dr. Doy Hutching.  Patient comfortable with the plan; to call us if any questions or concerns in the interim.   # DISPOSITION: # prolia today #Follow-up as needed- Dr.B  Dr.Sparks/Dr.Byrnett

## 2022-03-17 ENCOUNTER — Other Ambulatory Visit: Payer: Self-pay | Admitting: Internal Medicine

## 2022-03-17 DIAGNOSIS — Z1231 Encounter for screening mammogram for malignant neoplasm of breast: Secondary | ICD-10-CM

## 2022-03-29 ENCOUNTER — Other Ambulatory Visit: Payer: Self-pay | Admitting: General Surgery

## 2022-03-29 DIAGNOSIS — Z1231 Encounter for screening mammogram for malignant neoplasm of breast: Secondary | ICD-10-CM

## 2022-04-27 ENCOUNTER — Ambulatory Visit
Admission: RE | Admit: 2022-04-27 | Discharge: 2022-04-27 | Disposition: A | Discharge status: Home / Self Care | Payer: Medicare Other | Source: Ambulatory Visit | Attending: Internal Medicine | Admitting: Internal Medicine

## 2022-04-27 DIAGNOSIS — Z1231 Encounter for screening mammogram for malignant neoplasm of breast: Secondary | ICD-10-CM | POA: Insufficient documentation

## 2022-10-22 NOTE — Progress Notes (Unsigned)
Cardiology Office Note  Date:  10/23/2022   ID:  [name], DOB 03/09/1933, MRN [id number]  PCP:  Samantha Crouch, MD   Chief Complaint  Patient presents with   New Patient (Initial Visit)    Ref by Dr. Doy Hopkins for abnormal EKG with a spell of chest pain 6 weeks ago. Medications reviewed by the patient verbally.     HPI:  [name] is a 88 year old woman with past medical history of HTN Hyperlipidemia Breast cancer Reactive airway disease Former smoker, social, until age 51 Who presents by referral from [name] for atypical chest pain, abnormal EKG  Last seen by myself in clinic February 2020  Seen by primary care October 2023 Had episode of chest discomfort August 29, 2022, was watching TV when she developed chest discomfort mid chest without shortness of breath, nausea or sweating. Felt burning sensation and took Tums which gradually improved her symptoms   -Echocardiogram ordered by primary care, no acute findings noted, normal EF Concern for abnormal EKG  Stress at home, husband with medical issues, she is having to help with everything including bathroom trips Also has to help him at nighttime when he goes to the bathroom causing broken sleep Daughter with injury, was living with them while she had shoulder surgery Additional family in [place] in [place]  EKG personally reviewed by myself on todays visit Normal sinus rhythm rate 68 bpm left axis deviation, no change from EKG 2020  Other past medical history reviewed Prior treadmill stress test 01/2015  CT ABD: 02/2017 mild distal aortic atherosclerosis  PMH:   has a past medical history of Breast cancer ([place]) (2016), Cancer of right breast ([place]) (02/16/2015), Chronic cough, Dysrhythmia, Elevated lipase, GERD (gastroesophageal reflux disease), Heart murmur, Hyperlipidemia, Hypertension, Inflammatory arthritis, Lichen planus, Osteoarthritis, Peptic ulcer, Reactive airway disease, Seasonal allergies, Vaginal  atrophy, Valvular disease, and Vasomotor instability.  PSH:    Past Surgical History:  Procedure Laterality Date   AXILLARY LYMPH NODE BIOPSY Right 03/19/2015   Procedure: AXILLARY LYMPH NODE BIOPSY;  Surgeon: Christene Lye, MD;  Location: [facility];  Service: General;  Laterality: Right;   BREAST BIOPSY Right 02/16/15   POS   BREAST EXCISIONAL BIOPSY Left ~ 2012   "non malignant"   CARPAL TUNNEL RELEASE Right    COLONOSCOPY     COLONOSCOPY W/ POLYPECTOMY     ESOPHAGOGASTRODUODENOSCOPY (EGD) WITH PROPOFOL N/A 07/28/2015   Procedure: ESOPHAGOGASTRODUODENOSCOPY (EGD) WITH PROPOFOL;  Surgeon: Manya Silvas, MD;  Location: [place];  Service: Endoscopy;  Laterality: N/A;   MASTECTOMY Right 2016   BREAST CA   ORIF HUMERUS FRACTURE Right 03/16/2016   Procedure: OPEN REDUCTION INTERNAL FIXATION (ORIF) RIGHT PROXIMAL HUMERUS FRACTURE;  Surgeon: Justice Britain, MD;  Location: [place];  Service: Orthopedics;  Laterality: Right;   ORIF PROXIMAL HUMERUS FRACTURE Right 03/16/2016   SENTINEL NODE BIOPSY Right 03/19/2015   Procedure: SENTINEL NODE BIOPSY;  Surgeon: Christene Lye, MD;  Location: [facility];  Service: General;  Laterality: Right;   SIMPLE MASTECTOMY WITH AXILLARY SENTINEL NODE BIOPSY Right 03/19/2015   Procedure: right total MASTECTOMY with sentinel node biopsy ;  Surgeon: Christene Lye, MD;  Location: [facility];  Service: General;  Laterality: Right;   VAGINOPLASTY      Current Outpatient Medications  Medication Sig Dispense Refill   aspirin EC 81 MG tablet Take 81 mg by mouth every other day. Swallow whole.     atorvastatin (LIPITOR) 20 MG tablet Take 20  mg by mouth at bedtime.     Biotin 1000 MCG tablet Take 1,000 mcg by mouth daily.     Calcium Carbonate-Vitamin D (CALCIUM-VITAMIN D3) 600-200 MG-UNIT TABS Take 1 tablet by mouth 2 (two) times daily.     metoprolol succinate (TOPROL-XL) 25 MG 24 hr tablet Take 25 mg by mouth daily.      Multiple  Vitamins-Minerals (CENTRUM) tablet Take 1 tablet by mouth daily.     nebivolol (BYSTOLIC) 10 MG tablet TAKE 1 TABLET ONCE DAILY   PLEASE MAKE AN APPOINTMENT WITH YOUR DOCTOR     No current facility-administered medications for this visit.     Allergies:   Patient has no known allergies.   Social History:  The patient  reports that she quit smoking about 56 years ago. Her smoking use included cigarettes. She has never used smokeless tobacco. She reports current alcohol use of about 7.0 standard drinks of alcohol per week. She reports that she does not use drugs.   Family History:   family history includes Atrial fibrillation in her brother; Colon cancer (age of onset: 63) in her paternal uncle; Colon cancer (age of onset: 38) in her paternal grandfather; Diabetes in her daughter and mother; Heart disease in her father and mother.    Review of Systems: Review of Systems  Constitutional: Negative.   HENT: Negative.    Respiratory: Negative.    Cardiovascular:  Positive for chest pain.  Gastrointestinal:  Positive for heartburn.  Musculoskeletal: Negative.        Left arm numbness and pain at rest  Neurological: Negative.   Psychiatric/Behavioral: Negative.    All other systems reviewed and are negative.    PHYSICAL EXAM: VS:  BP 122/60 (BP Location: Left Arm, Patient Position: Sitting, Cuff Size: Normal)   Pulse 68   Ht '5\' 3"'$  (1.6 m)   Wt 152 lb 8 oz (69.2 kg)   SpO2 95%   BMI 27.01 kg/m  , BMI Body mass index is 27.01 kg/m. Constitutional:  oriented to person, place, and time. No distress.  HENT:  Head: Grossly normal Eyes:  no discharge. No scleral icterus.  Neck: No JVD, no carotid bruits  Cardiovascular: Regular rate and rhythm, no murmurs appreciated Pulmonary/Chest: Clear to auscultation bilaterally, no wheezes or rails Abdominal: Soft.  no distension.  no tenderness.  Musculoskeletal: Normal range of otion Neurological:  normal muscle tone. Coordination normal. No  atrophy Skin: Skin warm and dry Psychiatric: normal affect, pleasant   Recent Labs: No results found for requested labs within last 365 days.    Lipid Panel No results found for: "CHOL", "HDL", "LDLCALC", "TRIG"    Wt Readings from Last 3 Encounters:  10/23/22 152 lb 8 oz (69.2 kg)  12/24/20 152 lb 9.6 oz (69.2 kg)  04/28/20 156 lb (70.8 kg)    ASSESSMENT AND PLAN:  Chest pain Atypical in nature, likely GI in etiology, GERD Symptoms resolved with Tums, no further episodes prior to or since then No symptoms on exertion, leads a very busy lifestyle running around helping to take care of husband who has medical issues EKG unchanged compared to prior study 3 years ago Risk factors well-controlled, non-smoker, no diabetes, cholesterol very low on statin We have recommended if she has had no further episodes medical management could be recommended If she has recurrence of her chest pain additional testing could be ordered including Myoview versus cardiac CTA  Former smoker Stopped smoking age 68, social smoker  Aortic atherosclerosis ([place]) -  Plan: EKG 12-Lead Mild disease, risk factors well-controlled as above  Mixed hyperlipidemia Numbers reasonable on Lipitor No changes made  Adjustment disorder Stress taking care of his mom who has medical issues   Total encounter time more than 50 minutes  Greater than 50% was spent in counseling and coordination of care with the patient    No orders of the defined types were placed in this encounter.    Signed, Esmond Plants, M.D., Ph.D. 10/23/2022  [place], [place]

## 2022-10-23 ENCOUNTER — Encounter: Payer: Self-pay | Admitting: Cardiovascular Disease

## 2022-10-23 ENCOUNTER — Ambulatory Visit: Payer: Medicare Other | Attending: Cardiovascular Disease | Admitting: Cardiovascular Disease

## 2022-10-23 VITALS — BP 122/60 | HR 68 | Ht 63.0 in | Wt 152.5 lb

## 2022-10-23 DIAGNOSIS — I700 Atherosclerosis of aorta: Secondary | ICD-10-CM | POA: Insufficient documentation

## 2022-10-23 DIAGNOSIS — Z87891 Personal history of nicotine dependence: Secondary | ICD-10-CM | POA: Insufficient documentation

## 2022-10-23 DIAGNOSIS — R079 Chest pain, unspecified: Secondary | ICD-10-CM | POA: Insufficient documentation

## 2022-10-23 DIAGNOSIS — E782 Mixed hyperlipidemia: Secondary | ICD-10-CM | POA: Insufficient documentation

## 2022-10-23 NOTE — Patient Instructions (Signed)
Medication Instructions:  No changes  If you need a refill on your cardiac medications before your next appointment, please call your pharmacy.   Lab work: No new labs needed  Testing/Procedures: No new testing needed  Follow-Up: At [facility], you and your health needs are our priority.  As part of our continuing mission to provide you with exceptional heart care, we have created designated Provider Care Teams.  These Care Teams include your primary Cardiologist (physician) and Advanced Practice Providers (APPs -  Physician Assistants and Nurse Practitioners) who all work together to provide you with the care you need, when you need it.  You will need a follow up appointment as needed  Providers on your designated Care Team:   Christopher Berge, NP Ryan Dunn, PA-C Cadence Furth, PA-C  COVID-19 Vaccine Information can be found at: [email or web address] For questions related to vaccine distribution or appointments, please email [email or web address] or call [phone].    

## 2023-03-26 ENCOUNTER — Other Ambulatory Visit: Payer: Self-pay | Admitting: Internal Medicine

## 2023-03-26 DIAGNOSIS — Z1231 Encounter for screening mammogram for malignant neoplasm of breast: Secondary | ICD-10-CM

## 2023-04-30 ENCOUNTER — Ambulatory Visit
Admission: RE | Admit: 2023-04-30 | Discharge: 2023-04-30 | Disposition: A | Discharge status: Home / Self Care | Payer: Medicare Other | Source: Ambulatory Visit | Attending: Internal Medicine | Admitting: Internal Medicine

## 2023-04-30 DIAGNOSIS — Z1231 Encounter for screening mammogram for malignant neoplasm of breast: Secondary | ICD-10-CM | POA: Diagnosis present

## 2024-04-01 ENCOUNTER — Other Ambulatory Visit: Payer: Self-pay | Admitting: Internal Medicine

## 2024-04-01 DIAGNOSIS — Z1231 Encounter for screening mammogram for malignant neoplasm of breast: Secondary | ICD-10-CM

## 2024-04-30 ENCOUNTER — Ambulatory Visit
Admission: RE | Admit: 2024-04-30 | Discharge: 2024-04-30 | Disposition: A | Discharge status: Home / Self Care | Source: Ambulatory Visit | Attending: Internal Medicine | Admitting: Internal Medicine

## 2024-04-30 DIAGNOSIS — Z1231 Encounter for screening mammogram for malignant neoplasm of breast: Secondary | ICD-10-CM | POA: Insufficient documentation

## 2024-12-12 ENCOUNTER — Ambulatory Visit: Admitting: Urology

## 2024-12-16 ENCOUNTER — Ambulatory Visit: Admitting: Urology

## 2024-12-16 DIAGNOSIS — R310 Gross hematuria: Secondary | ICD-10-CM
